# Patient Record
Sex: Female | Born: 1955 | Race: Black or African American | Hispanic: No | Marital: Married | Smoking: Never smoker
Health system: Southern US, Community
[De-identification: ages and names within clinical notes are randomized; demographics above are authoritative.]

## PROBLEM LIST (undated history)

## (undated) DIAGNOSIS — I10 Essential (primary) hypertension: Secondary | ICD-10-CM

## (undated) DIAGNOSIS — G514 Facial myokymia: Secondary | ICD-10-CM

## (undated) DIAGNOSIS — Z923 Personal history of irradiation: Secondary | ICD-10-CM

## (undated) DIAGNOSIS — K08409 Partial loss of teeth, unspecified cause, unspecified class: Secondary | ICD-10-CM

## (undated) HISTORY — PX: BREAST CYST EXCISION: SHX579

## (undated) HISTORY — PX: BREAST LUMPECTOMY: SHX2

## (undated) HISTORY — DX: Essential (primary) hypertension: I10

## (undated) HISTORY — PX: TOOTH EXTRACTION: SUR596

## (undated) HISTORY — PX: BUNIONECTOMY: SHX129

## (undated) HISTORY — DX: Partial loss of teeth, unspecified cause, unspecified class: K08.409

## (undated) HISTORY — DX: Facial myokymia: G51.4

---

## 1999-07-16 ENCOUNTER — Ambulatory Visit (HOSPITAL_COMMUNITY): Admission: RE | Admit: 1999-07-16 | Discharge: 1999-07-16 | Payer: Self-pay | Admitting: *Deleted

## 1999-08-02 ENCOUNTER — Ambulatory Visit (HOSPITAL_COMMUNITY): Admission: RE | Admit: 1999-08-02 | Discharge: 1999-08-02 | Payer: Self-pay | Admitting: *Deleted

## 1999-08-25 ENCOUNTER — Encounter: Admission: RE | Admit: 1999-08-25 | Discharge: 1999-11-23 | Payer: Self-pay | Admitting: Radiation Oncology

## 1999-11-08 ENCOUNTER — Other Ambulatory Visit: Admission: RE | Admit: 1999-11-08 | Discharge: 1999-11-08 | Payer: Self-pay | Admitting: Obstetrics and Gynecology

## 1999-11-24 ENCOUNTER — Encounter: Admission: RE | Admit: 1999-11-24 | Discharge: 2000-02-22 | Payer: Self-pay | Admitting: Radiation Oncology

## 2000-05-23 ENCOUNTER — Encounter: Payer: Self-pay | Admitting: General Surgery

## 2000-05-23 ENCOUNTER — Encounter: Admission: RE | Admit: 2000-05-23 | Discharge: 2000-05-23 | Payer: Self-pay | Admitting: General Surgery

## 2000-07-21 ENCOUNTER — Encounter: Admission: RE | Admit: 2000-07-21 | Discharge: 2000-07-21 | Payer: Self-pay | Admitting: Oncology

## 2000-07-21 ENCOUNTER — Encounter: Payer: Self-pay | Admitting: Oncology

## 2001-05-25 ENCOUNTER — Encounter: Payer: Self-pay | Admitting: General Surgery

## 2001-05-25 ENCOUNTER — Encounter: Admission: RE | Admit: 2001-05-25 | Discharge: 2001-05-25 | Payer: Self-pay | Admitting: General Surgery

## 2001-08-01 ENCOUNTER — Other Ambulatory Visit: Admission: RE | Admit: 2001-08-01 | Discharge: 2001-08-01 | Payer: Self-pay | Admitting: Obstetrics and Gynecology

## 2002-06-11 ENCOUNTER — Encounter: Admission: RE | Admit: 2002-06-11 | Discharge: 2002-06-11 | Payer: Self-pay | Admitting: General Surgery

## 2002-06-11 ENCOUNTER — Encounter: Payer: Self-pay | Admitting: General Surgery

## 2002-12-13 ENCOUNTER — Other Ambulatory Visit: Admission: RE | Admit: 2002-12-13 | Discharge: 2002-12-13 | Payer: Self-pay | Admitting: Obstetrics and Gynecology

## 2003-03-06 ENCOUNTER — Encounter: Payer: Self-pay | Admitting: General Surgery

## 2003-03-06 ENCOUNTER — Encounter: Admission: RE | Admit: 2003-03-06 | Discharge: 2003-03-06 | Payer: Self-pay | Admitting: General Surgery

## 2003-03-31 ENCOUNTER — Ambulatory Visit (HOSPITAL_BASED_OUTPATIENT_CLINIC_OR_DEPARTMENT_OTHER): Admission: RE | Admit: 2003-03-31 | Discharge: 2003-03-31 | Payer: Self-pay | Admitting: General Surgery

## 2003-03-31 ENCOUNTER — Encounter: Payer: Self-pay | Admitting: General Surgery

## 2003-03-31 ENCOUNTER — Encounter: Admission: RE | Admit: 2003-03-31 | Discharge: 2003-03-31 | Payer: Self-pay | Admitting: General Surgery

## 2004-08-02 ENCOUNTER — Encounter: Admission: RE | Admit: 2004-08-02 | Discharge: 2004-08-02 | Payer: Self-pay | Admitting: General Surgery

## 2004-08-16 ENCOUNTER — Other Ambulatory Visit: Admission: RE | Admit: 2004-08-16 | Discharge: 2004-08-16 | Payer: Self-pay | Admitting: Obstetrics and Gynecology

## 2005-08-31 ENCOUNTER — Other Ambulatory Visit: Admission: RE | Admit: 2005-08-31 | Discharge: 2005-08-31 | Payer: Self-pay | Admitting: Obstetrics and Gynecology

## 2005-12-02 ENCOUNTER — Encounter: Admission: RE | Admit: 2005-12-02 | Discharge: 2005-12-02 | Payer: Self-pay | Admitting: General Surgery

## 2010-12-12 ENCOUNTER — Encounter: Payer: Self-pay | Admitting: General Surgery

## 2017-04-19 ENCOUNTER — Encounter: Payer: Self-pay | Admitting: Gastroenterology

## 2017-06-07 ENCOUNTER — Ambulatory Visit (AMBULATORY_SURGERY_CENTER): Payer: Self-pay | Admitting: *Deleted

## 2017-06-07 VITALS — Ht 64.0 in | Wt 131.0 lb

## 2017-06-07 DIAGNOSIS — Z1211 Encounter for screening for malignant neoplasm of colon: Secondary | ICD-10-CM

## 2017-06-07 MED ORDER — NA SULFATE-K SULFATE-MG SULF 17.5-3.13-1.6 GM/177ML PO SOLN: ORAL | 0 refills | Status: DC

## 2017-06-07 NOTE — Progress Notes (Signed)
Patient denies any allergies to eggs or soy. Patient denies any problems with anesthesia/sedation. Patient denies any oxygen use at home and does not take any diet/weight loss medications. EMMI education assisgned to patient on colonoscopy, this was explained and instructions given to patient. 

## 2017-06-21 ENCOUNTER — Ambulatory Visit (AMBULATORY_SURGERY_CENTER): Payer: BC Managed Care – PPO | Admitting: Gastroenterology

## 2017-06-21 ENCOUNTER — Encounter: Payer: Self-pay | Admitting: Gastroenterology

## 2017-06-21 VITALS — BP 147/86 | HR 58 | Temp 98.2°F | Resp 15 | Ht 64.0 in | Wt 131.0 lb

## 2017-06-21 DIAGNOSIS — D123 Benign neoplasm of transverse colon: Secondary | ICD-10-CM | POA: Diagnosis not present

## 2017-06-21 DIAGNOSIS — Z1211 Encounter for screening for malignant neoplasm of colon: Secondary | ICD-10-CM

## 2017-06-21 DIAGNOSIS — D124 Benign neoplasm of descending colon: Secondary | ICD-10-CM | POA: Diagnosis not present

## 2017-06-21 DIAGNOSIS — D122 Benign neoplasm of ascending colon: Secondary | ICD-10-CM | POA: Diagnosis not present

## 2017-06-21 DIAGNOSIS — Z1212 Encounter for screening for malignant neoplasm of rectum: Secondary | ICD-10-CM | POA: Diagnosis not present

## 2017-06-21 MED ORDER — SODIUM CHLORIDE 0.9 % IV SOLN: 500.0000 mL | INTRAVENOUS | Status: AC

## 2017-06-21 NOTE — Patient Instructions (Signed)
Handout given on polyps  YOU HAD AN ENDOSCOPIC PROCEDURE TODAY: Refer to the procedure report and other information in the discharge instructions given to you for any specific questions about what was found during the examination. If this information does not answer your questions, please call [place] office at [phone] to clarify.   YOU SHOULD EXPECT: Some feelings of bloating in the abdomen. Passage of more gas than usual. Walking can help get rid of the air that was put into your GI tract during the procedure and reduce the bloating. If you had a lower endoscopy (such as a colonoscopy or flexible sigmoidoscopy) you may notice spotting of blood in your stool or on the toilet paper. Some abdominal soreness may be present for a day or two, also.  DIET: Your first meal following the procedure should be a light meal and then it is ok to progress to your normal diet. A half-sandwich or bowl of soup is an example of a good first meal. Heavy or fried foods are harder to digest and may make you feel nauseous or bloated. Drink plenty of fluids but you should avoid alcoholic beverages for 24 hours. If you had a esophageal dilation, please see attached instructions for diet.    ACTIVITY: Your care partner should take you home directly after the procedure. You should plan to take it easy, moving slowly for the rest of the day. You can resume normal activity the day after the procedure however YOU SHOULD NOT DRIVE, use power tools, machinery or perform tasks that involve climbing or major physical exertion for 24 hours (because of the sedation medicines used during the test).   SYMPTOMS TO REPORT IMMEDIATELY: A gastroenterologist can be reached at any hour. Please call [phone]  for any of the following symptoms:  Following lower endoscopy (colonoscopy, flexible sigmoidoscopy) Excessive amounts of blood in the stool  Significant tenderness, worsening of abdominal pains  Swelling of the abdomen that is  new, acute  Fever of 100 or higher    FOLLOW UP:  If any biopsies were taken you will be contacted by phone or by letter within the next 1-3 weeks. Call [phone]  if you have not heard about the biopsies in 3 weeks.  Please also call with any specific questions about appointments or follow up tests.  

## 2017-06-21 NOTE — Op Note (Signed)
[facility] Patient Name: Christine Lozano Procedure Date: 06/21/2017 10:44 AM MRN: [id number] Endoscopist: Remo Lipps P. Trystan Eads MD, MD Age: 61 Referring MD:  Date of Birth: 09/17/1956 Gender: Female Account #: [id number] Procedure:                Colonoscopy Indications:              Screening for colorectal malignant neoplasm, This                            is the patient's first colonoscopy Medicines:                Monitored Anesthesia Care Procedure:                Pre-Anesthesia Assessment:                           - Prior to the procedure, a History and Physical                            was performed, and patient medications and                            allergies were reviewed. The patient's tolerance of                            previous anesthesia was also reviewed. The risks                            and benefits of the procedure and the sedation                            options and risks were discussed with the patient.                            All questions were answered, and informed consent                            was obtained. Prior Anticoagulants: The patient has                            taken no previous anticoagulant or antiplatelet                            agents. ASA Grade Assessment: I - A normal, healthy                            patient. After reviewing the risks and benefits,                            the patient was deemed in satisfactory condition to                            undergo the procedure.  After obtaining informed consent, the colonoscope                            was passed under direct vision. Throughout the                            procedure, the patient's blood pressure, pulse, and                            oxygen saturations were monitored continuously. The                            Colonoscope was introduced through the anus and                            advanced to the the cecum,  identified by                            appendiceal orifice and ileocecal valve. The                            colonoscopy was performed without difficulty. The                            patient tolerated the procedure well. The quality                            of the bowel preparation was good. The ileocecal                            valve, appendiceal orifice, and rectum were                            photographed. Scope In: 10:53:54 AM Scope Out: 11:13:34 AM Scope Withdrawal Time: 0 hours 14 minutes 29 seconds  Total Procedure Duration: 0 hours 19 minutes 40 seconds  Findings:                 The perianal and digital rectal examinations were                            normal.                           A 4 mm polyp was found in the ascending colon. The                            polyp was sessile. The polyp was removed with a                            cold snare. Resection and retrieval were complete.                           A 3 mm polyp was found in the transverse colon. The  polyp was sessile. The polyp was removed with a                            cold snare. Resection and retrieval were complete.                           A 3 mm polyp was found in the descending colon. The                            polyp was sessile. The polyp was removed with a                            cold snare. Resection and retrieval were complete.                           A few small-mouthed diverticula were found in the                            transverse colon and ascending colon.                           The colon was tortous. The rectal vault was small.                            The exam was otherwise without abnormality on                            direct and retroflexion views. Complications:            No immediate complications. Estimated blood loss:                            Minimal. Estimated Blood Loss:     Estimated blood loss was minimal. Impression:                - One 4 mm polyp in the ascending colon, removed                            with a cold snare. Resected and retrieved.                           - One 3 mm polyp in the transverse colon, removed                            with a cold snare. Resected and retrieved.                           - One 3 mm polyp in the descending colon, removed                            with a cold snare. Resected and retrieved.                           -  Diverticulosis in the transverse colon and in the                            ascending colon.                           - Tortous colon, small rectal vault.                           - The examination was otherwise normal on direct                            and retroflexion views. Recommendation:           - Patient has a contact number available for                            emergencies. The signs and symptoms of potential                            delayed complications were discussed with the                            patient. Return to normal activities tomorrow.                            Written discharge instructions were provided to the                            patient.                           - Resume previous diet.                           - Continue present medications.                           - Await pathology results.                           - Repeat colonoscopy is recommended for                            surveillance. The colonoscopy date will be                            determined after pathology results from today's                            exam become available for review. Remo Lipps P. Sadie Hazelett MD, MD 06/21/2017 11:17:44 AM This report has been signed electronically.

## 2017-06-21 NOTE — Progress Notes (Signed)
Called to room to assist during endoscopic procedure.  Patient ID and intended procedure confirmed with present staff. Received instructions for my participation in the procedure from the performing physician.  

## 2017-06-21 NOTE — Progress Notes (Signed)
Patient awakening,vss,report to rn 

## 2017-06-22 ENCOUNTER — Telehealth: Payer: Self-pay | Admitting: *Deleted

## 2017-06-22 NOTE — Telephone Encounter (Signed)
No answer for follow up call, left message and will attempt to call back later this afternoon. SM

## 2017-06-22 NOTE — Telephone Encounter (Signed)
  Follow up Call-  Call back number 06/21/2017  Post procedure Call Back phone  # [phone]  Permission to leave phone message Yes  Some recent data might be hidden     Patient questions:  Do you have a fever, pain , or abdominal swelling? No. Pain Score  0 *  Have you tolerated food without any problems? Yes.    Have you been able to return to your normal activities? Yes.    Do you have any questions about your discharge instructions: Diet   No. Medications  No. Follow up visit  No.  Do you have questions or concerns about your Care? No.  Actions: * If pain score is 4 or above: No action needed, pain <4.

## 2017-06-27 ENCOUNTER — Encounter: Payer: Self-pay | Admitting: Gastroenterology

## 2019-10-21 ENCOUNTER — Other Ambulatory Visit: Payer: Self-pay

## 2019-10-21 ENCOUNTER — Encounter: Payer: Self-pay | Admitting: Neurology

## 2019-10-21 ENCOUNTER — Ambulatory Visit (INDEPENDENT_AMBULATORY_CARE_PROVIDER_SITE_OTHER): Payer: BC Managed Care – PPO | Admitting: Neurology

## 2019-10-21 VITALS — BP 131/83 | HR 63 | Temp 97.5°F | Ht 64.0 in | Wt 134.0 lb

## 2019-10-21 DIAGNOSIS — G245 Blepharospasm: Secondary | ICD-10-CM | POA: Diagnosis not present

## 2019-10-21 NOTE — Progress Notes (Signed)
PATIENT: Christine Lozano DOB: [birth year]  Chief Complaint  Patient presents with  . Facial Twitching    She is here, with her husband, Marc Morgans, for intermittent facial twitching and bilateral eye spasms.  Marland Kitchen PCP    Fanny Bien, MD  . Ophthalmology    Calvert Cantor, MD - referring provider     HISTORICAL  Christine Lozano is a 63 year old female, seen in request by her ophthalmologist Dr. Calvert Cantor and her primary care physician Dr., [name] for evaluation of bilateral facial muscle spasm, initial evaluation was on October 21, 2019.  I have reviewed and summarized the referring note from the referring physician.  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  ALLERGIES: No Known Allergies  HOME MEDICATIONS: Current Outpatient Medications  Medication Sig Dispense Refill  . amLODipine (NORVASC) 5 MG tablet Take 1 tablet by mouth daily.     No current facility-administered medications for this visit.     PAST MEDICAL HISTORY: Past Medical History:  Diagnosis Date  . Facial twitching   . Hypertension     PAST SURGICAL HISTORY: Past Surgical History:  Procedure Laterality Date  . BREAST CYST EXCISION Left [id number]   x2  . BUNIONECTOMY Left 10 years ago  . TOOTH EXTRACTION      FAMILY HISTORY: Family History  Problem  Relation Age of Onset  . Cancer Mother   . Diabetes Father   . Colon cancer Neg Hx     SOCIAL HISTORY: Social History   Socioeconomic History  . Marital status: Married    Spouse name: Not on file  . Number of children: 2  . Years of education: college  . Highest education level: Master's degree (e.g., MA, MS, MEng, MEd, MSW, MBA)  Occupational History  . Occupation: Retired  Scientific laboratory technician  . Financial resource strain: Not on file  . Food insecurity    Worry: Not on file    Inability: Not on file  . Transportation needs    Medical: Not on file    Non-medical: Not on file  Tobacco Use  . Smoking status: Never Smoker  . Smokeless tobacco: Never Used  Substance and Sexual Activity  . Alcohol use: No  . Drug use: No  . Sexual activity: Not on file  Lifestyle  . Physical activity    Days per week: Not on file    Minutes per session: Not on file  . Stress: Not on file  Relationships  . Social Herbalist on phone: Not on file    Gets together: Not on file    Attends religious service: Not on file    Active member of club or organization: Not on file    Attends meetings of clubs or organizations: Not on file    Relationship status: Not on file  . Intimate partner violence  Fear of current or ex partner: Not on file    Emotionally abused: Not on file    Physically abused: Not on file    Forced sexual activity: Not on file  Other Topics Concern  . Not on file  Social History Narrative   Lives at home with her husband.   Left-handed.   No daily caffeine use.     PHYSICAL EXAM   Vitals:   10/21/19 0851  BP: 131/83  Pulse: 63  Temp: (!) 97.5 F (36.4 C)  Weight: 134 lb (60.8 kg)  Height: 5\' 4"  (1.626 m)    Not recorded      Body mass index is 23 kg/m.  PHYSICAL EXAMNIATION:  Gen: NAD, conversant, well nourised, well groomed                     Cardiovascular: Regular rate rhythm, no peripheral edema, warm, nontender. Eyes: Conjunctivae  clear without exudates or hemorrhage Neck: Supple, no carotid bruits. Pulmonary: Clear to auscultation bilaterally   NEUROLOGICAL EXAM:  MENTAL STATUS: Speech:    Speech is normal; fluent and spontaneous with normal comprehension.  Cognition:     Orientation to time, place and person     Normal recent and remote memory     Normal Attention span and concentration     Normal Language, naming, repeating,spontaneous speech     Fund of knowledge   CRANIAL NERVES: CN II: Visual fields are full to confrontation.  Pupils are round equal and briskly reactive to light. CN III, IV, VI: extraocular movement are normal. No ptosis. CN V: Facial sensation is intact to pinprick in all 3 divisions bilaterally. Corneal responses are intact.  CN VII: Face is symmetric with normal eye closure and smile.  She has forceful eye closure, significant corrugate, procerus,  frontalis muscle movement CN VIII: Hearing is normal to causal conversation. CN IX, X: Palate elevates symmetrically. Phonation is normal. CN XI: Head turning and shoulder shrug are intact CN XII: Tongue is midline with normal movements and no atrophy.  MOTOR: There is no pronator drift of out-stretched arms. Muscle bulk and tone are normal. Muscle strength is normal.  REFLEXES: Reflexes are 2+ and symmetric at the biceps, triceps, knees, and ankles. Plantar responses are flexor.  SENSORY: Intact to light touch, pinprick and vibratory sensation are intact in fingers and toes.  COORDINATION: There is no trunk or limb dysmetria noted.  GAIT/STANCE: Posture is normal. Gait is steady with normal steps, base, arm swing, and turning. Heel and toe walking are normal. Tandem gait is normal.  Romberg is absent.   DIAGNOSTIC DATA (LABS, IMAGING, TESTING) - I reviewed patient records, labs, notes, testing and imaging myself where available.   ASSESSMENT AND PLAN  Christine Lozano is a 63 y.o. female   Blepharospasm  MRI of the brain  to rule out structural lesion  EMG guided botulism toxin injection, asking for Xeomin 50 units  Marcial Pacas, M.D. Ph.D.  [facility] [address] [place], [place] [address] Ph: [phone] Fax: [phone]  CC: Calvert Cantor, MD

## 2019-10-21 NOTE — Patient Instructions (Signed)
Blepharospasm is a focal dystonia involving the orbicularis oculi muscles and other periocular muscles, including the procerus and corrugator muscles [79,80]. Clinical manifestations include increased blinking and spasms of involuntary eye closure. Symptoms are usually bilateral, synchronous, and symmetric, but may be asymmetric. Involuntary eye closure caused by forcible dystonic spasms of the orbicularis oculi should be distinguished from the more curtain-like "apraxia" of eyelid opening due to failure of levator palpebrae contraction. In some patients (particularly those with atypical parkinsonian syndromes), the two conditions can coexist. Proposed criteria for the diagnosis of blepharospasm are as follows [81]: ?The presence of stereotyped, bilateral, and synchronous orbicularis oculi spasms inducing eyelid narrowing/closure ?At least one of the following: .Presence of an effective "sensory trick" (ie, a maneuver, such as lightly touching the affected body part, that reduces or abolishes the dystonic symptoms) .Increased blinking Blepharospasm may be mild and nondisabling, or it may cause significant disability through interference with vision as a result of the eye closure. Patients with blepharospasm typically complain of increased spasms under conditions of bright light or stress, such as driving a car in traffic. Pain is infrequently associated with blepharospasm, although a feeling of irritation in the eyes (foreign body sensation) may be one of the first symptoms [82]. Blepharospasm may be associated with dystonia of the lower face and/or jaw (Meige syndrome or Brueghel syndrome)

## 2019-11-27 ENCOUNTER — Encounter: Payer: Self-pay | Admitting: Neurology

## 2019-11-27 ENCOUNTER — Ambulatory Visit (INDEPENDENT_AMBULATORY_CARE_PROVIDER_SITE_OTHER): Payer: BC Managed Care – PPO | Admitting: Neurology

## 2019-11-27 ENCOUNTER — Other Ambulatory Visit: Payer: Self-pay

## 2019-11-27 VITALS — BP 152/82 | HR 64 | Temp 97.1°F | Ht 64.0 in | Wt 136.0 lb

## 2019-11-27 DIAGNOSIS — G245 Blepharospasm: Secondary | ICD-10-CM

## 2019-11-27 MED ORDER — INCOBOTULINUMTOXINA 50 UNITS IM SOLR
50.0000 [IU] | INTRAMUSCULAR | Status: DC
  Administered 2019-11-27: 15:00:00 50 [IU] via INTRAMUSCULAR

## 2019-11-27 NOTE — Progress Notes (Signed)
**  Xeomin 50 units x 1 vial, NDC 0259-1605-01, Lot [id number], Exp 10/2021, office supply.//mck,rn** 

## 2019-11-27 NOTE — Progress Notes (Signed)
PATIENT: ROCHELE PHILSON DOB: 1955/11/25  Chief Complaint  Patient presents with  . Blepharospasm    Xeomin 50 units x 1 vial -  office supply     HISTORICAL  RONIA FERRY is a 64 year old female, seen in request by her ophthalmologist Dr. Calvert Cantor and her primary care physician Dr., [name] for evaluation of bilateral facial muscle spasm, initial evaluation was on October 21, 2019.  I have reviewed and summarized the referring note from the referring physician.  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 She did not have MRI of the brain due to cost concerns,   REVIEW OF SYSTEMS: Full 14 system review of systems performed and notable only for as above All other review of systems were negative.  ALLERGIES: No Known Allergies  HOME MEDICATIONS: Current Outpatient Medications  Medication Sig Dispense Refill  . amLODipine (NORVASC) 5 MG tablet Take 1 tablet by mouth daily.    Marland Kitchen incobotulinumtoxinA (XEOMIN) 50 units SOLR injection Inject 50 Units into the muscle every 3 (three) months.     No current facility-administered medications for this visit.    PAST MEDICAL HISTORY: Past Medical History:  Diagnosis Date  . Facial twitching   . Hypertension     PAST SURGICAL HISTORY: Past Surgical History:  Procedure Laterality Date  . BREAST CYST EXCISION Left  [id number]   x2  . BUNIONECTOMY Left 10 years ago  . TOOTH EXTRACTION      FAMILY HISTORY: Family History  Problem Relation Age of Onset  . Cancer Mother   . Diabetes Father   . Colon cancer Neg Hx     SOCIAL HISTORY: Social History   Socioeconomic History  . Marital status: Married    Spouse name: Not on file  . Number of children: 2  . Years of education: college  . Highest education level: Master's degree (e.g., MA, MS, MEng, MEd, MSW, MBA)  Occupational History  . Occupation: Retired  Tobacco Use  . Smoking status: Never Smoker  . Smokeless tobacco: Never Used  Substance and Sexual Activity  . Alcohol use: No  . Drug use: No  . Sexual activity: Not on file  Other Topics Concern  . Not on file  Social History Narrative   Lives at home with her husband.   Left-handed.   No daily caffeine use.   Social Determinants of Health   Financial Resource Strain:   . Difficulty of Paying Living Expenses: Not on file  Food Insecurity:   . Worried About Charity fundraiser in the Last Year: Not on file  . Ran Out of Food in the Last Year: Not on file  Transportation Needs:   . Lack of Transportation (Medical): Not on file  . Lack of Transportation (Non-Medical): Not on file  Physical Activity:   . Days of Exercise per Week: Not on file  . Minutes of Exercise per Session:  Not on file  Stress:   . Feeling of Stress : Not on file  Social Connections:   . Frequency of Communication with Friends and Family: Not on file  . Frequency of Social Gatherings with Friends and Family: Not on file  . Attends Religious Services: Not on file  . Active Member of Clubs or Organizations: Not on file  . Attends Archivist Meetings: Not on file  . Marital Status: Not on file  Intimate Partner Violence:   . Fear of Current or Ex-Partner: Not on file  . Emotionally Abused: Not on file  . Physically Abused: Not on file  . Sexually Abused: Not on file     PHYSICAL EXAM     Vitals:   11/27/19 1358  BP: (!) 152/82  Pulse: 64  Temp: (!) 97.1 F (36.2 C)  Weight: 136 lb (61.7 kg)  Height: 5\' 4"  (1.626 m)    Not recorded      Body mass index is 23.34 kg/m.  PHYSICAL EXAMNIATION: She has frequent forceful bilateral closing,  DIAGNOSTIC DATA (LABS, IMAGING, TESTING) - I reviewed patient records, labs, notes, testing and imaging myself where available.   ASSESSMENT AND PLAN  AUBRIANNE STEPRO is a 64 y.o. female   Blepharospasm  EMG guided Xeomin injection, Xeomin 50 units, used 30 units, discarded 20 units  Right orbicularis oculi at 4, 5, 6, 7, 8, 9:00 (2.5 units each x6=15 units) Left orbicularis oculi at 3, 4, 5, 6, 7, 8 =15 units  Right corrugate 5 units Left corrugate 5 units Prosperous 5 units   Marcial Pacas, M.D. Ph.D.  [facility] [address] [place], [place] [address] Ph: [phone] Fax: [phone]  CC: Calvert Cantor, MD

## 2019-12-02 ENCOUNTER — Telehealth: Payer: Self-pay | Admitting: Neurology

## 2019-12-02 NOTE — Telephone Encounter (Signed)
I was able to get in touch with the patient.  She denies any drooping but just has some bruising on around her right eye.  She understands this should heal with time.  She will call back with any further concerns.

## 2019-12-02 NOTE — Telephone Encounter (Signed)
Left message, on both home and mobile numbers, requesting a return call.

## 2019-12-02 NOTE — Telephone Encounter (Signed)
Left second set of message on both home and mobile numbers.

## 2019-12-02 NOTE — Telephone Encounter (Signed)
Patient had her injections on 11/27/2019, she states the next evening her eye turned black, she wants to know if this is normal and when it will go away

## 2019-12-12 ENCOUNTER — Telehealth: Payer: Self-pay

## 2019-12-12 NOTE — Telephone Encounter (Signed)
I called the patient.  The droopy eyelid is a known complication from Xeomin injections around the eye, she was getting this for blepharospasm.  This should be transient in nature, there is no specific treatment other than conservative measures to let the strength return.

## 2019-12-12 NOTE — Telephone Encounter (Signed)
Patient called stating she is having drooping eyelid (left) and was unsure if they are going to get better post her injection.   Please follow up

## 2020-01-13 ENCOUNTER — Ambulatory Visit: Payer: BC Managed Care – PPO | Attending: Family

## 2020-01-13 DIAGNOSIS — Z23 Encounter for immunization: Secondary | ICD-10-CM | POA: Insufficient documentation

## 2020-01-13 NOTE — Progress Notes (Signed)
   Covid-19 Vaccination Clinic  Name:  Christine Lozano    MRN: [id number] DOB: 22-Mar-1956  01/13/2020  [name] was observed post Covid-19 immunization for 15 minutes without incidence. She was provided with Vaccine Information Sheet and instruction to access the V-Safe system.   [name] was instructed to call 911 with any severe reactions post vaccine: Marland Kitchen Difficulty breathing  . Swelling of your face and throat  . A fast heartbeat  . A bad rash all over your body  . Dizziness and weakness    Immunizations Administered    Name Date Dose VIS Date Route   Moderna COVID-19 Vaccine 01/13/2020 10:30 AM 0.5 mL 10/22/2019 Intramuscular   Manufacturer: Moderna   Lot: [id number]   [id number]

## 2020-02-11 ENCOUNTER — Ambulatory Visit: Payer: BC Managed Care – PPO | Attending: Family

## 2020-02-11 DIAGNOSIS — Z23 Encounter for immunization: Secondary | ICD-10-CM

## 2020-02-11 NOTE — Progress Notes (Signed)
   Covid-19 Vaccination Clinic  Name:  [name]    MRN: [id number] DOB: December 13, 1955  02/11/2020  [name] was observed post Covid-19 immunization for 15 minutes without incident. She was provided with Vaccine Information Sheet and instruction to access the V-Safe system.   [name] was instructed to call 911 with any severe reactions post vaccine: Marland Kitchen Difficulty breathing  . Swelling of face and throat  . A fast heartbeat  . A bad rash all over body  . Dizziness and weakness   Immunizations Administered    Name Date Dose VIS Date Route   Moderna COVID-19 Vaccine 02/11/2020 12:24 PM 0.5 mL 10/22/2019 Intramuscular   Manufacturer: Moderna   LotMV:[id number]   ThayerBE:[id number]

## 2020-02-20 ENCOUNTER — Telehealth: Payer: Self-pay | Admitting: *Deleted

## 2020-02-20 NOTE — Telephone Encounter (Signed)
I called [employer] [phone] and spoke to [place].  He states that [phone] is billable but requires PA.  [phone] is billable and does not require PA.  Ref# for call is [id number].  He directed me to call [phone] to obtain PA.  I called and spoke to Thayer who states there is already a PA on file.  PA# is [id number] Valid from 11-26-2019 through 11/25/2020.

## 2020-02-27 ENCOUNTER — Other Ambulatory Visit: Payer: Self-pay

## 2020-02-27 ENCOUNTER — Encounter: Payer: Self-pay | Admitting: Neurology

## 2020-02-27 ENCOUNTER — Ambulatory Visit (INDEPENDENT_AMBULATORY_CARE_PROVIDER_SITE_OTHER): Payer: BC Managed Care – PPO | Admitting: Neurology

## 2020-02-27 VITALS — BP 144/82 | HR 72 | Temp 96.0°F | Ht 64.0 in | Wt 137.5 lb

## 2020-02-27 DIAGNOSIS — G245 Blepharospasm: Secondary | ICD-10-CM

## 2020-02-27 NOTE — Progress Notes (Signed)
PATIENT: Christine Lozano DOB: [birth year]  Chief Complaint  Patient presents with  . Blepharospasm    Xeomin 50 units x 1 vial - office supply     HISTORICAL  Christine Lozano is a 64 year old female, seen in request by her ophthalmologist Dr. Calvert Cantor and her primary care physician Dr., [name] for evaluation of bilateral facial muscle spasm, initial evaluation was on October 21, 2019.  I have reviewed and summarized the referring note from the referring physician.  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 She did not have MRI of the brain due to cost concerns,  UPDATE March 06 2020: She only has mild transient improvement with previously EMG guided Xeomin injection for her bilateral carpal spasm, there was no significant side effect noted.  REVIEW OF SYSTEMS: Full 14 system review of systems performed and notable only for as above All other review of systems were negative.  ALLERGIES: No Known Allergies  HOME MEDICATIONS: Current Outpatient Medications  Medication Sig Dispense Refill  . amLODipine (NORVASC) 5 MG tablet Take 1 tablet by mouth daily.    Marland Kitchen incobotulinumtoxinA (XEOMIN) 50 units SOLR injection Inject 50 Units into the muscle every 3 (three) months.     No current facility-administered medications for this visit.    PAST MEDICAL HISTORY: Past  Medical History:  Diagnosis Date  . Facial twitching   . Hypertension     PAST SURGICAL HISTORY: Past Surgical History:  Procedure Laterality Date  . BREAST CYST EXCISION Left [id number]   x2  . BUNIONECTOMY Left 10 years ago  . TOOTH EXTRACTION      FAMILY HISTORY: Family History  Problem Relation Age of Onset  . Cancer Mother   . Diabetes Father   . Colon cancer Neg Hx     SOCIAL HISTORY: Social History   Socioeconomic History  . Marital status: Married    Spouse name: Not on file  . Number of children: 2  . Years of education: college  . Highest education level: Master's degree (e.g., MA, MS, MEng, MEd, MSW, MBA)  Occupational History  . Occupation: Retired  Tobacco Use  . Smoking status: Never Smoker  . Smokeless tobacco: Never Used  Substance and Sexual Activity  . Alcohol use: No  . Drug use: No  . Sexual activity: Not on file  Other Topics Concern  . Not on file  Social History Narrative   Lives at home with her husband.   Left-handed.   No daily caffeine use.   Social Determinants of Health   Financial Resource Strain:   . Difficulty of Paying Living Expenses:   Food Insecurity:   . Worried About Charity fundraiser in the Last Year:   . Arboriculturist in the Last Year:   Transportation Needs:   . Film/video editor (Medical):   Marland Kitchen Lack of Transportation (Non-Medical):   Physical Activity:   .  Days of Exercise per Week:   . Minutes of Exercise per Session:   Stress:   . Feeling of Stress :   Social Connections:   . Frequency of Communication with Friends and Family:   . Frequency of Social Gatherings with Friends and Family:   . Attends Religious Services:   . Active Member of Clubs or Organizations:   . Attends Archivist Meetings:   Marland Kitchen Marital Status:   Intimate Partner Violence:   . Fear of Current or Ex-Partner:   . Emotionally Abused:   Marland Kitchen Physically Abused:   . Sexually Abused:      PHYSICAL EXAM   Vitals:    02/27/20 1527  BP: (!) 144/82  Pulse: 72  Temp: (!) 96 F (35.6 C)  Weight: 137 lb 8 oz (62.4 kg)  Height: 5\' 4"  (1.626 m)    Not recorded      Body mass index is 23.6 kg/m.  PHYSICAL EXAMNIATION: She has frequent forceful bilateral eyes closing, most movement  DIAGNOSTIC DATA (LABS, IMAGING, TESTING) - I reviewed patient records, labs, notes, testing and imaging myself where available.   ASSESSMENT AND PLAN  Christine Lozano is a 64 y.o. female   Blepharospasm  EMG guided Xeomin injection, Xeomin 50 units,   Right orbicularis oculi at 4, 5, 6, 7, 8, 9:00 (2.5 units each x6=15 units) Left orbicularis oculi at 3, 4, 5, 6, 7, 8 =15 units  Right corrugate 5 units Left corrugate 5 units Prosperous 5 units  Right frontalis 5 unitsx2=10 units Left frontalis 5 unitsx2=10  units  Marcial Pacas, M.D. Ph.D.  [facility] [address] [place], [place] [address] Ph: [phone] Fax: [phone]  CC: Calvert Cantor, MD

## 2020-02-27 NOTE — Progress Notes (Signed)
**  Xeomin 50 units x 1 vial, NDC 0259-1605-01, Lot [id number], Exp 10/2021, office supply.//mck,rn** 

## 2020-03-06 DIAGNOSIS — G245 Blepharospasm: Secondary | ICD-10-CM

## 2020-03-06 MED ORDER — INCOBOTULINUMTOXINA 50 UNITS IM SOLR
50.0000 [IU] | INTRAMUSCULAR | Status: DC
  Administered 2020-03-06: 14:00:00 50 [IU] via INTRAMUSCULAR

## 2020-06-02 ENCOUNTER — Encounter: Payer: Self-pay | Admitting: Neurology

## 2020-06-02 ENCOUNTER — Ambulatory Visit (INDEPENDENT_AMBULATORY_CARE_PROVIDER_SITE_OTHER): Payer: BC Managed Care – PPO | Admitting: Neurology

## 2020-06-02 VITALS — BP 130/88 | HR 56 | Ht 64.0 in | Wt 138.0 lb

## 2020-06-02 DIAGNOSIS — G245 Blepharospasm: Secondary | ICD-10-CM

## 2020-06-02 MED ORDER — INCOBOTULINUMTOXINA 50 UNITS IM SOLR
50.0000 [IU] | INTRAMUSCULAR | Status: DC
  Administered 2020-06-02: 50 [IU] via INTRAMUSCULAR

## 2020-06-02 NOTE — Progress Notes (Signed)
PATIENT: Christine Lozano DOB: 1956-09-15  Chief Complaint  Patient presents with  . Injections     HISTORICAL  Christine Lozano is a 64 year old female, seen in request by her ophthalmologist Dr. Calvert Cantor and her primary care physician Dr., [name] for evaluation of bilateral facial muscle spasm, initial evaluation was on October 21, 2019.  I have reviewed and summarized the referring note from the referring physician.  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 She did not have MRI of the brain due to cost concerns,  UPDATE March 06 2020: She only has mild transient improvement with previously EMG guided Xeomin injection for her bilateral carpal spasm, there was no significant side effect noted.  UPDATE June 02 2020: She reported mild improvement with previous injection, no significant side effect noted  REVIEW OF SYSTEMS: Full 14 system review of systems performed and notable only for as above All other review of systems were negative.  ALLERGIES: No Known Allergies  HOME MEDICATIONS: Current Outpatient Medications  Medication Sig Dispense Refill  . amLODipine (NORVASC) 5 MG tablet Take 1 tablet by mouth daily.    Marland Kitchen incobotulinumtoxinA (XEOMIN) 50 units SOLR injection Inject 50 Units into the muscle every 3 (three) months.     Current Facility-Administered  Medications  Medication Dose Route Frequency Provider Last Rate Last Admin  . incobotulinumtoxinA (XEOMIN) 50 units injection 50 Units  50 Units Intramuscular Q90 days Marcial Pacas, MD   50 Units at 03/06/20 1407    PAST MEDICAL HISTORY: Past Medical History:  Diagnosis Date  . Facial twitching   . Hypertension     PAST SURGICAL HISTORY: Past Surgical History:  Procedure Laterality Date  . BREAST CYST EXCISION Left [id number]   x2  . BUNIONECTOMY Left 10 years ago  . TOOTH EXTRACTION      FAMILY HISTORY: Family History  Problem Relation Age of Onset  . Cancer Mother   . Diabetes Father   . Colon cancer Neg Hx     SOCIAL HISTORY: Social History   Socioeconomic History  . Marital status: Married    Spouse name: Not on file  . Number of children: 2  . Years of education: college  . Highest education level: Master's degree (e.g., MA, MS, MEng, MEd, MSW, MBA)  Occupational History  . Occupation: Retired  Tobacco Use  . Smoking status: Never Smoker  . Smokeless tobacco: Never Used  Vaping Use  . Vaping Use: Never used  Substance and Sexual Activity  . Alcohol use: No  . Drug use: No  . Sexual activity: Not on file  Other Topics Concern  . Not on file  Social History Narrative   Lives at home with her husband.   Left-handed.   No daily caffeine use.   Social Determinants of Health   Financial Resource Strain:   . Difficulty of Paying Living Expenses:   Food Insecurity:   .  Worried About Charity fundraiser in the Last Year:   . Arboriculturist in the Last Year:   Transportation Needs:   . Film/video editor (Medical):   Marland Kitchen Lack of Transportation (Non-Medical):   Physical Activity:   . Days of Exercise per Week:   . Minutes of Exercise per Session:   Stress:   . Feeling of Stress :   Social Connections:   . Frequency of Communication with Friends and Family:   . Frequency of Social Gatherings with Friends and Family:   . Attends Religious Services:     . Active Member of Clubs or Organizations:   . Attends Archivist Meetings:   Marland Kitchen Marital Status:   Intimate Partner Violence:   . Fear of Current or Ex-Partner:   . Emotionally Abused:   Marland Kitchen Physically Abused:   . Sexually Abused:      PHYSICAL EXAM   There were no vitals filed for this visit. Not recorded     There is no height or weight on file to calculate BMI.  PHYSICAL EXAMNIATION: She has frequent forceful bilateral eyes closing, most movement  DIAGNOSTIC DATA (LABS, IMAGING, TESTING) - I reviewed patient records, labs, notes, testing and imaging myself where available.   ASSESSMENT AND PLAN  Christine Lozano is a 64 y.o. female   Blepharospasm  EMG guided Xeomin injection, Xeomin 50 units,   Right orbicularis oculi at 4, 5, 6, 7, 8, 9:00 (2.5 units each x6=15 units) Left orbicularis oculi at 3, 4, 5, 6, 7, 8 =15 units  Right corrugate 5 units Left corrugate 5 units Prosperous 5 units  Right frontalis 5 unitsx2=10 units Left frontalis 5 unitsx2=10  units  Marcial Pacas, M.D. Ph.D.  [facility] [address] [place], [place] [address] Ph: [phone] Fax: [phone]  CC: Calvert Cantor, MD

## 2020-06-02 NOTE — Progress Notes (Signed)
Xeomin 50 units X 1 vial- [id number], LOT [id number], EXP date- 02/2022. Office supply, B/B/ CBC, RN

## 2020-09-02 ENCOUNTER — Ambulatory Visit (INDEPENDENT_AMBULATORY_CARE_PROVIDER_SITE_OTHER): Payer: BC Managed Care – PPO | Admitting: Neurology

## 2020-09-02 ENCOUNTER — Encounter: Payer: Self-pay | Admitting: Neurology

## 2020-09-02 ENCOUNTER — Telehealth: Payer: Self-pay | Admitting: Neurology

## 2020-09-02 VITALS — BP 140/82 | HR 65 | Ht 64.0 in | Wt 140.5 lb

## 2020-09-02 DIAGNOSIS — G245 Blepharospasm: Secondary | ICD-10-CM

## 2020-09-02 MED ORDER — INCOBOTULINUMTOXINA 50 UNITS IM SOLR
50.0000 [IU] | INTRAMUSCULAR | Status: DC
Start: 2020-09-02 — End: 2020-12-09
  Administered 2020-09-02: 50 [IU] via INTRAMUSCULAR

## 2020-09-02 MED ORDER — XEOMIN 50 UNITS IM SOLR: 50.0000 [IU] | INTRAMUSCULAR | 3 refills | Status: DC

## 2020-09-02 NOTE — Telephone Encounter (Signed)
Faxed PA request to [facility].

## 2020-09-02 NOTE — Telephone Encounter (Signed)
Patient has appointment today. She currently has a PA on file for Xeomin with [employer] that expires 11/25/2020. I am filling out a new PA to add [place].   Christine Lozano, can you send patient's Xeomin prescription to [facility] in [place], [place]?

## 2020-09-02 NOTE — Addendum Note (Signed)
Addended by: Noberto Retort C on: 09/02/2020 08:09 AM   Modules accepted: Orders

## 2020-09-02 NOTE — Progress Notes (Signed)
**  Xeomin 50 units x 1 vilas, [place] 8280-0349-17, Lot [id number], Exp 02/2022, office supply.//mck,rn**

## 2020-09-02 NOTE — Progress Notes (Signed)
PATIENT: Christine Lozano DOB: 1956-08-07  Chief Complaint  Patient presents with  . Blepharospasm    Xeomin     HISTORICAL  Christine Lozano is a 64 year old female, seen in request by her ophthalmologist Dr. Calvert Lozano and her primary care physician Dr., Christine Lozano for evaluation of bilateral facial muscle spasm, initial evaluation was on October 21, 2019.  I have reviewed and summarized the referring note from the referring physician.  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 She did not have MRI of the brain due to cost concerns,  UPDATE March 06 2020: She only has mild transient improvement with previously EMG guided Xeomin injection for her bilateral blepharospasm, there was no significant side effect noted.  UPDATE June 02 2020: She reported mild improvement with previous injection, no significant side effect noted  UPDATE Sep 02 2020: She denies significant improvement with previous injection, there was no significant side effect noted.  REVIEW OF SYSTEMS: Full 14 system review of systems performed and notable only for as above All other review of systems were negative.  ALLERGIES: No Known Allergies  HOME MEDICATIONS: Current Outpatient Medications  Medication Sig Dispense Refill  . amLODipine (NORVASC) 5 MG tablet Take 1 tablet by mouth daily.    Marland Kitchen  incobotulinumtoxinA (XEOMIN) 50 units SOLR injection Inject 50 Units into the muscle every 3 (three) months. 1 each 3   No current facility-administered medications for this visit.    PAST MEDICAL HISTORY: Past Medical History:  Diagnosis Date  . Facial twitching   . Hypertension     PAST SURGICAL HISTORY: Past Surgical History:  Procedure Laterality Date  . BREAST CYST EXCISION Left 6712,4580   x2  . BUNIONECTOMY Left 10 years ago  . TOOTH EXTRACTION      FAMILY HISTORY: Family History  Problem Relation Age of Onset  . Cancer Mother   . Diabetes Father   . Colon cancer Neg Hx     SOCIAL HISTORY: Social History   Socioeconomic History  . Marital status: Married    Spouse name: Not on file  . Number of children: 2  . Years of education: college  . Highest education level: Master's degree (e.g., MA, MS, MEng, MEd, MSW, MBA)  Occupational History  . Occupation: Retired  Tobacco Use  . Smoking status: Never Smoker  . Smokeless tobacco: Never Used  Vaping Use  . Vaping Use: Never used  Substance and Sexual Activity  . Alcohol use: No  . Drug use: No  . Sexual activity: Not on file  Other Topics Concern  . Not on file  Social History Narrative   Lives at home with her husband.   Left-handed.   No daily caffeine use.   Social Determinants of Health   Financial Resource Strain:   . Difficulty of Paying Living Expenses: Not on file  Food Insecurity:   . Worried About [employer] of  Food in the Last Year: Not on file  . Ran Out of Food in the Last Year: Not on file  Transportation Needs:   . Lack of Transportation (Medical): Not on file  . Lack of Transportation (Non-Medical): Not on file  Physical Activity:   . Days of Exercise per Week: Not on file  . Minutes of Exercise per Session: Not on file  Stress:   . Feeling of Stress : Not on file  Social Connections:   . Frequency of Communication with Friends and Family: Not on file  . Frequency of Social  Gatherings with Friends and Family: Not on file  . Attends Religious Services: Not on file  . Active Member of Clubs or Organizations: Not on file  . Attends Archivist Meetings: Not on file  . Marital Status: Not on file  Intimate Partner Violence:   . Fear of Current or Ex-Partner: Not on file  . Emotionally Abused: Not on file  . Physically Abused: Not on file  . Sexually Abused: Not on file     PHYSICAL EXAM   Vitals:   09/02/20 1259  BP: 140/82  Pulse: 65  Weight: 140 lb 8 oz (63.7 kg)  Height: 5\' 4"  (1.626 m)   Not recorded     Body mass index is 24.12 kg/m.  PHYSICAL EXAMNIATION: She has frequent forceful bilateral eyes closing, most movement  DIAGNOSTIC DATA (LABS, IMAGING, TESTING) - I reviewed patient records, labs, notes, testing and imaging myself where available.   ASSESSMENT AND PLAN  Christine Lozano is a 64 y.o. female   Blepharospasm  EMG guided Xeomin injection, Xeomin 50 units,   Right orbicularis oculi at 2, 4, 5, 6, 7, 8, 9:00 (2.5 units each x 7=17.5 units) Left orbicularis oculi at 3, 4, 5, 6, 7, 8,9 =17.5 units  Right corrugate 5 units Left corrugate 5 units Prosperous 5 units   Christine Lozano, M.D. Ph.D.  [facility] [address], [place], [place] [address] Ph: [phone] Fax: [phone]  CC: Christine Cantor, MD

## 2020-09-02 NOTE — Telephone Encounter (Signed)
Rx sent to requested pharmacy

## 2020-09-07 ENCOUNTER — Encounter: Payer: Self-pay | Admitting: Neurology

## 2020-09-07 NOTE — Telephone Encounter (Signed)
I called [facility] and spoke with Borris to give PA information. I then called the patient and LVM asking her to call back to discuss specialty pharmacy process.

## 2020-09-07 NOTE — Telephone Encounter (Signed)
Received updated PA via fax. PA #[id number] (09/02/20- 09/01/21). [facility] added as the specialty pharmacy. Information letter with specialty pharmacy protocol has been mailed out to patient.

## 2020-09-07 NOTE — Telephone Encounter (Signed)
I received a fax from [facility] needing prescription clarification. 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 Vicente Males states she will add it to the prescription and have it reviewed.

## 2020-09-10 NOTE — Telephone Encounter (Signed)
I received a fax from [place] stating that a PA is needed from [facility] in order to fill the Xeomin. I called [facility] and spoke with PA department to begin process. PA was approved, [place] #[id number] (09/10/20- 09/10/21). I faxed the PA approval back to [facility].

## 2020-09-21 NOTE — Telephone Encounter (Signed)
I called the patient and LVM requesting she call back to discuss specialty pharmacy protocol.

## 2020-11-03 ENCOUNTER — Telehealth: Payer: Self-pay | Admitting: Neurology

## 2020-11-03 NOTE — Telephone Encounter (Signed)
Patient has a Xeomin appointment on 12/09/20. Patient called and LVM to advise she received a letter in the mail stating approval for Xeomin and wanted to know if there is anything she needs to do. I called [facility] and spoke with Gesenia to check the status of the Xeomin order. Dolphus Jenny states that the order was cancelled and placed me on hold to look further into the situation. After checking, she was still unsure. She restarted the prescription. She states once the prescription is processed, we will be contacted to schedule delivery.

## 2020-11-04 NOTE — Telephone Encounter (Signed)
I returned patient's call and LVM regarding the specialty pharmacy and letter she received, requested patient call me back to discuss.

## 2020-11-10 NOTE — Telephone Encounter (Signed)
I called [facility] and spoke with Dequandra to check status of Xeomin order. She states delivery is ready to be scheduled but they are needing patient's consent. They have attempted to reach the patient with no luck. I called the patient and LVM asking her to return my call, and to call [facility] to give consent for Xeomin shipment.

## 2020-11-23 NOTE — Telephone Encounter (Signed)
I called [facility] to check status of Xeomin. I spoke with CiCi. She states that the order is ready to schedule but they need patient's consent. CiCi placed me on hold and called the patient. The patient gave lifetime consent. Xeomin TBD 1/6.

## 2020-11-30 NOTE — Telephone Encounter (Signed)
(  1) 50U vial of Xeomin arrived from [facility]. Late entry.

## 2020-12-09 ENCOUNTER — Encounter: Payer: Self-pay | Admitting: Neurology

## 2020-12-09 ENCOUNTER — Ambulatory Visit (INDEPENDENT_AMBULATORY_CARE_PROVIDER_SITE_OTHER): Payer: BC Managed Care – PPO | Admitting: Neurology

## 2020-12-09 ENCOUNTER — Other Ambulatory Visit: Payer: Self-pay

## 2020-12-09 VITALS — BP 116/78 | HR 67 | Ht 64.0 in | Wt 140.0 lb

## 2020-12-09 DIAGNOSIS — G245 Blepharospasm: Secondary | ICD-10-CM

## 2020-12-09 NOTE — Progress Notes (Unsigned)
**  Xeomin 50 units x 1 vial, [place] 0259-1605-01. Lot [id number], Exp 02/2022, specialty pharmacy.//mck,rn**

## 2020-12-09 NOTE — Progress Notes (Signed)
PATIENT: Christine Lozano DOB: 1956-04-06  Chief Complaint  Patient presents with  . Blepharospasm    Xeomin     HISTORICAL  Christine Lozano is a 65 year old female, seen in request by her ophthalmologist Dr. Calvert Cantor and her primary care physician Dr., [name] for evaluation of bilateral facial muscle spasm, initial evaluation was on October 21, 2019.  I have reviewed and summarized the referring note from the referring physician.  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 She did not have MRI of the brain due to cost concerns,  UPDATE March 06 2020: She only has mild transient improvement with previously EMG guided Xeomin injection for her bilateral blepharospasm, there was no significant side effect noted.  UPDATE June 02 2020: She reported mild improvement with previous injection, no significant side effect noted  UPDATE Sep 02 2020: She denies significant improvement with previous injection, there was no significant side effect noted.  Update December 09, 2020: She responded to previous injection, but short lasting, began to notice recurrent eye blinking after 2 months   REVIEW OF SYSTEMS: Full 14 system review of systems performed and notable only for as above All other review of systems were negative.  ALLERGIES: No Known Allergies  HOME  MEDICATIONS: Current Outpatient Medications  Medication Sig Dispense Refill  . amLODipine (NORVASC) 5 MG tablet Take 1 tablet by mouth daily.    Marland Kitchen incobotulinumtoxinA (XEOMIN) 50 units SOLR injection Inject 50 Units into the muscle every 3 (three) months. 1 each 3   No current facility-administered medications for this visit.    PAST MEDICAL HISTORY: Past Medical History:  Diagnosis Date  . Facial twitching   . Hypertension     PAST SURGICAL HISTORY: Past Surgical History:  Procedure Laterality Date  . BREAST CYST EXCISION Left 7829,5621   x2  . BUNIONECTOMY Left 10 years ago  . TOOTH EXTRACTION      FAMILY HISTORY: Family History  Problem Relation Age of Onset  . Cancer Mother   . Diabetes Father   . Colon cancer Neg Hx     SOCIAL HISTORY: Social History   Socioeconomic History  . Marital status: Married    Spouse name: Not on file  . Number of children: 2  . Years of education: college  . Highest education level: Master's degree (e.g., MA, MS, MEng, MEd, MSW, MBA)  Occupational History  . Occupation: Retired  Tobacco Use  . Smoking status: Never Smoker  . Smokeless tobacco: Never Used  Vaping Use  . Vaping Use: Never used  Substance and Sexual Activity  . Alcohol use: No  . Drug use: No  . Sexual activity: Not on file  Other Topics Concern  . Not on file  Social History Narrative   Lives at home with her husband.   Left-handed.   No daily caffeine use.   Social Determinants of Radio broadcast assistant  Strain: Not on file  Food Insecurity: Not on file  Transportation Needs: Not on file  Physical Activity: Not on file  Stress: Not on file  Social Connections: Not on file  Intimate Partner Violence: Not on file     PHYSICAL EXAM   Vitals:   12/09/20 1539  BP: 116/78  Pulse: 67  Weight: 140 lb (63.5 kg)  Height: 5\' 4"  (1.626 m)   Not recorded     Body mass index is 24.03 kg/m.  PHYSICAL EXAMNIATION: She has frequent forceful  bilateral eyes closing, most movement  DIAGNOSTIC DATA (LABS, IMAGING, TESTING) - I reviewed patient records, labs, notes, testing and imaging myself where available.   ASSESSMENT AND PLAN  Christine Lozano is a 65 y.o. female   Blepharospasm  EMG guided Xeomin injection, Xeomin 50 units,   Right orbicularis oculi at 2, 4, 5, 6, 7, 8, 9:00 (2.5 units each x 7=17.5 units) Left orbicularis oculi at 3, 4, 5, 6, 7, 8,9 =17.5 units  Right corrugate 5 units Left corrugate 5 units Prosperous 5 units   Marcial Pacas, M.D. Ph.D.  [facility] [address] [place], [place] [address] Ph: [phone] Fax: [phone]  CC: Calvert Cantor, MD

## 2021-01-25 ENCOUNTER — Other Ambulatory Visit: Payer: Self-pay | Admitting: Obstetrics and Gynecology

## 2021-01-25 DIAGNOSIS — R928 Other abnormal and inconclusive findings on diagnostic imaging of breast: Secondary | ICD-10-CM

## 2021-02-09 ENCOUNTER — Ambulatory Visit: Payer: BC Managed Care – PPO

## 2021-02-09 ENCOUNTER — Other Ambulatory Visit: Payer: Self-pay

## 2021-02-09 ENCOUNTER — Ambulatory Visit
Admission: RE | Admit: 2021-02-09 | Discharge: 2021-02-09 | Disposition: A | Discharge status: Home / Self Care | Payer: BC Managed Care – PPO | Source: Ambulatory Visit | Attending: Obstetrics and Gynecology | Admitting: Obstetrics and Gynecology

## 2021-02-09 DIAGNOSIS — R928 Other abnormal and inconclusive findings on diagnostic imaging of breast: Secondary | ICD-10-CM

## 2021-03-02 ENCOUNTER — Telehealth: Payer: Self-pay | Admitting: Neurology

## 2021-03-02 NOTE — Telephone Encounter (Signed)
Spoke with patient. We rescheduled her appointment to 4/26. I advised that I would call her with any cancellations. Patient confirmed she has Medicare and Tricare. I advised that we will not be using [facility] under this insurance.

## 2021-03-02 NOTE — Telephone Encounter (Signed)
Patient is scheduled for Xeomin on 4/20 with Dr. Krista Blue. I called patient and LVM to reschedule because Dr. Krista Blue will be out of office. I requested patient return my call.  I also received a fax from [facility] stating that patient must be B/B for Xeomin due to insurance change. She had BCBS but now has Medicare A & B as well as Tricare. I will also verify this with patient when she returns my call.

## 2021-03-10 ENCOUNTER — Ambulatory Visit: Payer: Self-pay | Admitting: Neurology

## 2021-03-16 ENCOUNTER — Telehealth: Payer: Self-pay | Admitting: Neurology

## 2021-03-16 ENCOUNTER — Ambulatory Visit (INDEPENDENT_AMBULATORY_CARE_PROVIDER_SITE_OTHER): Payer: Medicare Other | Admitting: Neurology

## 2021-03-16 ENCOUNTER — Encounter: Payer: Self-pay | Admitting: Neurology

## 2021-03-16 VITALS — BP 121/70 | HR 63 | Ht 64.0 in | Wt 139.0 lb

## 2021-03-16 DIAGNOSIS — G245 Blepharospasm: Secondary | ICD-10-CM | POA: Diagnosis not present

## 2021-03-16 DIAGNOSIS — G244 Idiopathic orofacial dystonia: Secondary | ICD-10-CM

## 2021-03-16 MED ORDER — INCOBOTULINUMTOXINA 50 UNITS IM SOLR
50.0000 [IU] | INTRAMUSCULAR | Status: DC
  Administered 2021-03-16: 50 [IU] via INTRAMUSCULAR

## 2021-03-16 MED ORDER — TRIHEXYPHENIDYL HCL 2 MG PO TABS: 2.0000 mg | ORAL_TABLET | Freq: Three times a day (TID) | ORAL | 3 refills | Status: DC

## 2021-03-16 NOTE — Progress Notes (Signed)
PATIENT: Christine Lozano DOB: October 15, 1956  Chief Complaint  Patient presents with  . Procedure    Blepharospasm of both eyes - Xeomin     HISTORICAL  Christine Lozano is a 65 year old female, seen in request by her ophthalmologist Dr. Calvert Cantor and her primary care physician Dr., Christine Lozano for evaluation of bilateral facial muscle spasm, initial evaluation was on October 21, 2019.  I have reviewed and summarized the referring note from the referring physician.   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, she noticed occasionally bilateral around eye muscle spasm, forceful eye closure, difficulty opening, it is gradually getting worse, become more frequent, present 50% of her day, she has quit driving, she enjoys oil painting, it does not affect her painting, sometimes she has such forceful eye closure, difficulty opening her eyes, she has to go to bed early  She denies chewing difficulty, no vocal tics, no limb muscle involvement  We began EMG guided Xeomin injection since January 2021, received a low dose mainly around orbicularis oculi on consecutive injections on April 16, July 13, September 02, 2020, December 10, 2019, and the most recent second injection was on March 16, 2021  Each time she came back was not sure about the benefit of injection, there was also noticeable facial grimace, tightening of platysmas muscle, during interview,  Patient denies significant mood disorder, or functional limitation, she is the caregiver of her 39 year old father, but she denied difficulty handling her daily activity, denies depression anxiety, has never tried medication treatment.  She prefers academic referral,   REVIEW OF SYSTEMS: Full 14 system review of systems performed and notable only for as above All other review of systems were negative.  ALLERGIES: No Known Allergies  HOME MEDICATIONS: Current Outpatient  Medications  Medication Sig Dispense Refill  . amLODipine (NORVASC) 5 MG tablet Take 1 tablet by mouth daily.    Marland Kitchen incobotulinumtoxinA (XEOMIN) 50 units SOLR injection Inject 50 Units into the muscle every 3 (three) months. 1 each 3   No current facility-administered medications for this visit.    PAST MEDICAL HISTORY: Past Medical History:  Diagnosis Date  . Facial twitching   . Hypertension     PAST SURGICAL HISTORY: Past Surgical History:  Procedure Laterality Date  . BREAST CYST EXCISION Left [id number],[id number]   x2  . BUNIONECTOMY Left 10 years ago  . TOOTH EXTRACTION      FAMILY HISTORY: Family History  Problem Relation Age of Onset  . Cancer Mother   . Diabetes Father   . Colon cancer Neg Hx     SOCIAL HISTORY: Social History   Socioeconomic History  . Marital status: Married    Spouse name: Not on file  . Number of children: 2  . Years of education: college  . Highest education level: Master's degree (e.g., MA, MS, MEng, MEd, MSW, MBA)  Occupational History  . Occupation: Retired  Tobacco Use  . Smoking status: Never Smoker  . Smokeless tobacco: Never Used  Vaping Use  . Vaping Use: Never used  Substance and Sexual Activity  . Alcohol use: No  . Drug use: No  . Sexual activity: Not on file  Other Topics Concern  . Not on file  Social History Narrative   Lives at home with her husband.   Left-handed.   No daily caffeine use.   Social Determinants of Health   Financial Resource Strain: Not  on file  Food Insecurity: Not on file  Transportation Needs: Not on file  Physical Activity: Not on file  Stress: Not on file  Social Connections: Not on file  Intimate Partner Violence: Not on file     PHYSICAL EXAM   Vitals:   03/16/21 0910  BP: 121/70  Pulse: 63  Weight: 139 lb (63 kg)  Height: 5\' 4"  (1.626 m)   Not recorded     Body mass index is 23.86 kg/m.  PHYSICAL EXAMNIATION:   PHYSICAL EXAMNIATION:  Gen: NAD, conversant, well  nourised, well groomed                     Cardiovascular: Regular rate rhythm, no peripheral edema, warm, nontender. Eyes: Conjunctivae clear without exudates or hemorrhage Neck: Supple, no carotid bruits. Pulmonary: Clear to auscultation bilaterally   NEUROLOGICAL EXAM:  MENTAL STATUS: Speech/Cognition: Awake, alert, normal speech, oriented to history taking and casual conversation.  CRANIAL NERVES: She has frequent eye blinking, facial grimace tightening of the platysmas muscle, CN II: Visual fields are full to confrontation.  Pupils are round equal and briskly reactive to light. CN III, IV, VI: extraocular movement are normal. No ptosis. CN V: Facial sensation is intact to light touch. CN VII: Face is symmetric with normal eye closure and smile. CN VIII: Hearing is normal to casual conversation CN IX, X: Palate elevates symmetrically. Phonation is normal. CN XI: Head turning and shoulder shrug are intact   MOTOR: Muscle bulk and tone are normal. Muscle strength is normal.  REFLEXES: Reflexes are 2  and symmetric at the biceps, triceps, knees and ankles. Plantar responses are flexor.  SENSORY: Intact to light touch, pinprick, positional and vibratory sensation at fingers and toes.  COORDINATION: There is no trunk or limb ataxia.    GAIT/STANCE: Posture is normal. Gait is steady with normal steps, base, arm swing and turning.   DIAGNOSTIC DATA (LABS, IMAGING, TESTING) - I reviewed patient records, labs, notes, testing and imaging myself where available.   ASSESSMENT AND PLAN  Christine Lozano is a 65 y.o. female   Meige syndrome  Suboptimal response to EMG guided low-dose Xeomin injection,  Will try low-dose artane 2mg  tid.  Refer to [facility]   EMG guided Xeomin injection, Xeomin 50 units, used 37.5 units, discard 12.5 units Right orbicularis oculi at 2, 4, 5, 6, 7, 8,(2.5 units each x 6=15units) Left orbicularis oculi at 3, 4, 5, 6, 7, 8 =15  units  Right corrugate 2.5 units Left corrugate 2.5 units Prosperous 2.5 units   Marcial Pacas, M.D. Ph.D.  [facility] [address], [place], [place] [address] Ph: [phone] Fax: [phone]  CC: Calvert Cantor, MD

## 2021-03-16 NOTE — Telephone Encounter (Signed)
Left message requesting a return call.

## 2021-03-16 NOTE — Telephone Encounter (Signed)
Please let patient know, I have referred her to [facility], with a phone number as below, on the website, they also talk about telemetry options at [facility]  I also started Artane 2 mg 3 times daily, potential side effect listed, she may started at low dose half tablets 3 times a day, gradually titrating up the dosage,      Gastrointestinal: Nausea (30% to 50% .), Xerostomia (30% to 50% )  Neurologic: Dizziness (30% to 50% )  Ophthalmic: Blurred vision (30% to 50% )  Psychiatric: Feeling nervous (30% to 50% )   [phone] to schedule an appointment.  Click here to learn more about Telehealth options at [facility].

## 2021-03-16 NOTE — Telephone Encounter (Signed)
She is aware to expect a call from [facility] to get scheduled w/ the movement disorder clinic.  She was in agreement to try Artane 2mg , one tab TID. She verbalized understanding to start at half dose TID and slowly titrate up to minimize side effects.

## 2021-03-16 NOTE — Progress Notes (Signed)
**  Xeomin 50 units x 1, NDC 0259-1605-01, Lot [id number], Exp 02/2022, office supply.//mck,rn**

## 2021-06-16 ENCOUNTER — Ambulatory Visit: Payer: Medicare Other | Admitting: Neurology

## 2021-07-20 ENCOUNTER — Other Ambulatory Visit: Payer: Self-pay | Admitting: Radiology

## 2021-07-20 DIAGNOSIS — N6310 Unspecified lump in the right breast, unspecified quadrant: Secondary | ICD-10-CM

## 2021-07-29 ENCOUNTER — Ambulatory Visit
Admission: RE | Admit: 2021-07-29 | Discharge: 2021-07-29 | Disposition: A | Discharge status: Home / Self Care | Payer: Medicare Other | Source: Ambulatory Visit | Attending: Radiology | Admitting: Radiology

## 2021-07-29 ENCOUNTER — Other Ambulatory Visit: Payer: Self-pay

## 2021-07-29 ENCOUNTER — Other Ambulatory Visit: Payer: Self-pay | Admitting: Radiology

## 2021-07-29 DIAGNOSIS — N6310 Unspecified lump in the right breast, unspecified quadrant: Secondary | ICD-10-CM

## 2021-08-04 ENCOUNTER — Other Ambulatory Visit: Payer: Self-pay

## 2021-08-04 ENCOUNTER — Ambulatory Visit
Admission: RE | Admit: 2021-08-04 | Discharge: 2021-08-04 | Disposition: A | Discharge status: Home / Self Care | Payer: Medicare Other | Source: Ambulatory Visit | Attending: Radiology | Admitting: Radiology

## 2021-08-04 ENCOUNTER — Other Ambulatory Visit: Payer: Self-pay | Admitting: Radiology

## 2021-08-04 DIAGNOSIS — N6310 Unspecified lump in the right breast, unspecified quadrant: Secondary | ICD-10-CM

## 2021-08-04 HISTORY — PX: BREAST BIOPSY: SHX20

## 2021-08-05 ENCOUNTER — Other Ambulatory Visit: Payer: Self-pay | Admitting: Radiology

## 2021-08-05 DIAGNOSIS — Z170 Estrogen receptor positive status [ER+]: Secondary | ICD-10-CM

## 2021-08-05 DIAGNOSIS — C50911 Malignant neoplasm of unspecified site of right female breast: Secondary | ICD-10-CM

## 2021-08-05 DIAGNOSIS — R928 Other abnormal and inconclusive findings on diagnostic imaging of breast: Secondary | ICD-10-CM

## 2021-08-06 ENCOUNTER — Other Ambulatory Visit: Payer: Self-pay | Admitting: Radiology

## 2021-08-06 DIAGNOSIS — C50911 Malignant neoplasm of unspecified site of right female breast: Secondary | ICD-10-CM

## 2021-08-07 ENCOUNTER — Ambulatory Visit
Admission: RE | Admit: 2021-08-07 | Discharge: 2021-08-07 | Disposition: A | Discharge status: Home / Self Care | Payer: Medicare Other | Source: Ambulatory Visit | Attending: Radiology | Admitting: Radiology

## 2021-08-07 ENCOUNTER — Other Ambulatory Visit: Payer: Self-pay

## 2021-08-07 DIAGNOSIS — C50911 Malignant neoplasm of unspecified site of right female breast: Secondary | ICD-10-CM

## 2021-08-07 MED ORDER — GADOBUTROL 1 MMOL/ML IV SOLN
6.0000 mL | Freq: Once | INTRAVENOUS | Status: AC | PRN
  Administered 2021-08-07: 6 mL via INTRAVENOUS

## 2021-08-09 ENCOUNTER — Encounter: Payer: Self-pay | Admitting: *Deleted

## 2021-08-09 ENCOUNTER — Telehealth: Payer: Self-pay | Admitting: Hematology and Oncology

## 2021-08-09 ENCOUNTER — Ambulatory Visit
Admission: RE | Admit: 2021-08-09 | Discharge: 2021-08-09 | Disposition: A | Discharge status: Home / Self Care | Payer: Medicare Other | Source: Ambulatory Visit | Attending: Radiology | Admitting: Radiology

## 2021-08-09 ENCOUNTER — Other Ambulatory Visit: Payer: Self-pay

## 2021-08-09 DIAGNOSIS — Z170 Estrogen receptor positive status [ER+]: Secondary | ICD-10-CM

## 2021-08-09 DIAGNOSIS — C50911 Malignant neoplasm of unspecified site of right female breast: Secondary | ICD-10-CM

## 2021-08-09 DIAGNOSIS — I10 Essential (primary) hypertension: Secondary | ICD-10-CM | POA: Insufficient documentation

## 2021-08-09 HISTORY — DX: Personal history of irradiation: Z92.3

## 2021-08-09 NOTE — Telephone Encounter (Signed)
Left message for patient to return my call in reference to another message stating there was a conflict with upcoming appointment, left my contact for patient to return call to confirm if she is still coming to appointment on Wednesday

## 2021-08-09 NOTE — Telephone Encounter (Signed)
Patient called back to confirm she will be here Wednesday for clinic appointment, she was just confused on location of appointment

## 2021-08-09 NOTE — Telephone Encounter (Signed)
Spoke to patient to confirm afternoon clinic appointment, packet sent via email

## 2021-08-10 ENCOUNTER — Other Ambulatory Visit: Payer: Self-pay

## 2021-08-10 ENCOUNTER — Encounter: Payer: Self-pay | Admitting: *Deleted

## 2021-08-10 DIAGNOSIS — C50311 Malignant neoplasm of lower-inner quadrant of right female breast: Secondary | ICD-10-CM | POA: Insufficient documentation

## 2021-08-10 DIAGNOSIS — C50919 Malignant neoplasm of unspecified site of unspecified female breast: Secondary | ICD-10-CM

## 2021-08-10 NOTE — Progress Notes (Signed)
Radiation Oncology         [phone] ________________________________  Initial Outpatient Consultation  Name: Christine Lozano MRN: [id number]  Date: 08/11/2021  DOB: 06-01-1956  YQ:IHKVQ, Mechele Claude, MD  Jovita Kussmaul, MD   REFERRING PHYSICIAN: Autumn Messing III, MD  DIAGNOSIS:    ICD-10-CM   1. Malignant neoplasm of lower-inner quadrant of right breast of female, estrogen receptor positive ([place])  C50.311    Z17.0      Cancer Staging Malignant neoplasm of lower-inner quadrant of right female breast [facility]) Staging form: Breast, AJCC 8th Edition - Clinical: Stage IB (cT2, cN0, cM0, G2, ER+, PR+, HER2-) - Signed by Nicholas Lose, MD on 08/11/2021 Histologic grading system: 3 grade system  Stage IB (cT2, cN0, cM0) Right Breast LIQ Invasive Ductal Carcinoma, ER+ / PR+ / Her2-, Grade 2  CHIEF COMPLAINT: Here to discuss management of right breast cancer  HISTORY OF PRESENT ILLNESS::Christine Lozano is a 65 y.o. female who has a history of right breast cancer treated with lumpectomy and radiation approximately around 2004 at [facility]. Per records (mammogram report,) "The patient underwent lumpectomy and radiation therapy for right breast cancer in 2000 and 2001.  She had a re-excision of the lumpectomy site in 2004."  She believes she received RT to the breast only, for 6 wks.  She believes she had Stage I or II cancer.  The patient recently presented with new breast abnormality defined by a 1 cm irregular lower inner right breast mass on the following imaging: diagnostic right mammography with tomo and cad and right breast ultrasound on the date of 07/29/21.  Symptoms, if any, at that time, were: palpable thickening of the retroareolar right breast.  Right breast needle core biopsy at the 4 o'clock position on date of 08/04/21 showed invasive ductal carcinoma; measuring 0.9 cm in the greatest linear extent.  ER status: +; PR status +, Her2 status equivocal pending further testing; Grade 2.    Bilateral breast MRI performed on 08/07/21 demonstrated the biopsy proven recurrent malignancy in the retroareolar right breast measuring up to 2.1 cm. An additional suspicious irregular enhancing mass, measuring up to 1.2 cm, was also visualized in the upper, slightly inner right breast at a posterior depth. No evidence of malignancy was seen in the left breast.  Diagnostic left mammogram performed on 08/09/21 confirmed no evidence of malignancy in the left breast.   She is active in artistic activities - drama, gospel signing, painting.  PREVIOUS RADIATION THERAPY: Yes, history of right breast cancer treated with radiation in 2004 as above.  PAST MEDICAL HISTORY:  has a past medical history of Facial twitching, Hypertension, and Personal history of radiation therapy.    PAST SURGICAL HISTORY: Past Surgical History:  Procedure Laterality Date   BREAST BIOPSY Right 08/04/2021   BREAST CYST EXCISION Left [id number]   x2   BREAST LUMPECTOMY     BUNIONECTOMY Left 10 years ago   TOOTH EXTRACTION      FAMILY HISTORY: family history includes Cancer in her mother; Diabetes in her father.  SOCIAL HISTORY:  reports that she has never smoked. She has never used smokeless tobacco. She reports that she does not drink alcohol and does not use drugs.  ALLERGIES: Patient has no known allergies.  MEDICATIONS:  Current Outpatient Medications  Medication Sig Dispense Refill   amLODipine (NORVASC) 5 MG tablet Take 1 tablet by mouth daily.     incobotulinumtoxinA (XEOMIN) 50 units SOLR injection Inject 50 Units into the  muscle every 3 (three) months. 1 each 3   trihexyphenidyl (ARTANE) 2 MG tablet Take 1 tablet (2 mg total) by mouth 3 (three) times daily with meals. 90 tablet 3   Current Facility-Administered Medications  Medication Dose Route Frequency Provider Last Rate Last Admin   incobotulinumtoxinA (XEOMIN) 50 units injection 50 Units  50 Units Intramuscular Q90 days Marcial Pacas, MD   50 Units  at 03/16/21 0945    REVIEW OF SYSTEMS: As above   PHYSICAL EXAM:  vitals were not taken for this visit.   General: Alert and oriented, in no acute distress Heart: Regular in rate and rhythm with no murmurs, rubs, or gallops. Chest: Clear to auscultation bilaterally, with no rhonchi, wheezes, or rales. Extremities: No cyanosis or edema. Skin: No concerning lesions. Musculoskeletal: symmetric strength and muscle tone throughout. Neurologic: Cranial nerves II through XII are grossly intact. No obvious focalities. Speech is fluent. Coordination is intact. Psychiatric: Judgment and insight are intact. Affect is appropriate. Breasts: firmness/thickening in lower outer right breast and tissue concavity/tenderness in lower inner right breast. No other palpable masses appreciated in the breasts or axillae bilaterally.  ECOG = 0  0 - Asymptomatic (Fully active, able to carry on all predisease activities without restriction)  1 - Symptomatic but completely ambulatory (Restricted in physically strenuous activity but ambulatory and able to carry out work of a light or sedentary nature. For example, light housework, office work)  2 - Symptomatic, <50% in bed during the day (Ambulatory and capable of all self care but unable to carry out any work activities. Up and about more than 50% of waking hours)  3 - Symptomatic, >50% in bed, but not bedbound (Capable of only limited self-care, confined to bed or chair 50% or more of waking hours)  4 - Bedbound (Completely disabled. Cannot carry on any self-care. Totally confined to bed or chair)  5 - Death   Eustace Pen MM, Creech RH, Tormey DC, et al. [phone]). "Toxicity and response criteria of the [facility]". [place] Oncol. 5 (6): 649-55   LABORATORY DATA:  Lab Results  Component Value Date   WBC 4.0 08/11/2021   HGB 12.8 08/11/2021   HCT 38.8 08/11/2021   MCV 83.4 08/11/2021   PLT 223 08/11/2021   CMP     Component Value  Date/Time   NA 142 08/11/2021 1234   K 4.1 08/11/2021 1234   CL 106 08/11/2021 1234   CO2 27 08/11/2021 1234   GLUCOSE 93 08/11/2021 1234   BUN 12 08/11/2021 1234   CREATININE 0.86 08/11/2021 1234   CALCIUM 9.3 08/11/2021 1234   PROT 7.6 08/11/2021 1234   ALBUMIN 4.2 08/11/2021 1234   AST 20 08/11/2021 1234   ALT 13 08/11/2021 1234   ALKPHOS 87 08/11/2021 1234   BILITOT 0.5 08/11/2021 1234   GFRNONAA >60 08/11/2021 1234         RADIOGRAPHY: MR BREAST BILATERAL W WO CONTRAST INC CAD  Result Date: 08/09/2021 CLINICAL DATA:  65 year old female recently diagnosed with recurrent right breast cancer. LABS:  None performed on site. EXAM: BILATERAL BREAST MRI WITH AND WITHOUT CONTRAST TECHNIQUE: Multiplanar, multisequence MR images of both breasts were obtained prior to and following the intravenous administration of 6 ml of Gadavist. Three-dimensional MR images were rendered by post-processing of the original MR data on an independent workstation. The three-dimensional MR images were interpreted, and findings are reported in the following complete MRI report for this study. Three dimensional images were  evaluated at the independent interpreting workstation using the DynaCAD thin client. COMPARISON:  Previous exam(s). FINDINGS: Breast composition: b. Scattered fibroglandular tissue. Background parenchymal enhancement: Mild. Right breast: Note is made of postsurgical changes throughout the central right breast. Susceptibility artifact from post biopsy marking clip is seen in association with an irregular, enhancing mass in the retroareolar right breast (series 6, image 75/144). This is consistent with the patient's site of biopsy proven malignancy. It measures 2.1 x 1.2 x 1.2 cm. A second, similar area of irregular enhancement is seen in the upper, slightly inner aspect of the right breast at posterior depth (series 6, image 46/144). It measures 1.2 x 1.0 x 1.0 cm. No additional suspicious enhancement  throughout the remainder of the right breast. Left breast: No suspicious mass or abnormal enhancement. Lymph nodes: No abnormal appearing lymph nodes. Ancillary findings:  None. IMPRESSION: 1. Biopsy-proven, recurrent malignancy in the retroareolar right breast measuring up to 2.1 cm. 2. Additional, suspicious irregular enhancing mass measuring up to 1.2 cm in the upper, slightly inner right breast at posterior depth (series 6, image 46). 3. No MRI evidence of malignancy on the left. 4. No suspicious lymphadenopathy. RECOMMENDATION: Additional image guided biopsy of the second site of suspicious enhancement in the upper inner right breast can be performed if this will alter clinical management. If so, evaluation with second-look ultrasound and possible ultrasound-guided biopsy can be attempted. If no sonographic correlate is identified, then MRI guided biopsy can be performed. BI-RADS CATEGORY  4: Suspicious. Electronically Signed   By: Kristopher Oppenheim M.D.   On: 08/09/2021 11:52  US BREAST LTD UNI RIGHT INC AXILLA  Result Date: 07/29/2021 CLINICAL DATA:  65 year old female with focal pain and palpable thickening in the RETROAREOLAR RIGHT breast, which has now improved. Patient has a history of RIGHT breast cancer and lumpectomy in 2001. EXAM: DIGITAL DIAGNOSTIC UNILATERAL RIGHT MAMMOGRAM WITH TOMOSYNTHESIS AND CAD; ULTRASOUND RIGHT BREAST LIMITED TECHNIQUE: Right digital diagnostic mammography and breast tomosynthesis was performed. The images were evaluated with computer-aided detection.; Targeted ultrasound examination of the right breast was performed COMPARISON:  Previous exam(s). ACR Breast Density Category b: There are scattered areas of fibroglandular density. FINDINGS: 2D/3D full field and spot compression views of the RIGHT breast demonstrate lumpectomy changes within the RETROAREOLAR/central RIGHT breast. No new suspicious mammographic findings are noted within the RIGHT breast. On physical exam,  palpable thickening at the 4 o'clock position of the RIGHT breast 1 cm from the nipple is identified. Targeted ultrasound is performed, showing a 0.6 x 1 x 0.6 cm irregular hypoechoic mass at the 4 o'clock position of the RIGHT breast 1 cm from the nipple. No abnormal RIGHT axillary lymph nodes are noted. IMPRESSION: 1. 1 cm irregular LOWER INNER RIGHT breast mass. Tissue sampling is recommended. 2. No abnormal appearing RIGHT axillary lymph nodes. RECOMMENDATION: Ultrasound-guided RIGHT breast biopsy, which will be scheduled. I have discussed the findings and recommendations with the patient. If applicable, a reminder letter will be sent to the patient regarding the next appointment. BI-RADS CATEGORY  4: Suspicious. Electronically Signed   By: Margarette Canada M.D.   On: 07/29/2021 14:28  MM DIAG BREAST TOMO UNI LEFT  Result Date: 08/09/2021 CLINICAL DATA:  Recent diagnosis right breast cancer. EXAM: DIGITAL DIAGNOSTIC UNILATERAL LEFT MAMMOGRAM WITH TOMOSYNTHESIS AND CAD TECHNIQUE: Left digital diagnostic mammography and breast tomosynthesis was performed. The images were evaluated with computer-aided detection. COMPARISON:  Previous exam(s). ACR Breast Density Category b: There are scattered areas of fibroglandular density.  FINDINGS: Scarring is again seen in the upper-outer left breast. An asymmetry in the superior left breast resolves on additional imaging. No suspicious or new masses comparing to multiple previous studies. No evidence of malignancy on the left. IMPRESSION: No mammographic evidence of malignancy on the left. RECOMMENDATION: Recommend continued surgical and oncologic follow-up for the known right breast cancer. The patient has a breast MRI pending. I have discussed the findings and recommendations with the patient. If applicable, a reminder letter will be sent to the patient regarding the next appointment. BI-RADS CATEGORY  2: Benign. Electronically Signed   By: Dorise Bullion III M.D.   On:  08/09/2021 11:41  MM DIAG BREAST TOMO UNI RIGHT  Result Date: 07/29/2021 CLINICAL DATA:  65 year old female with focal pain and palpable thickening in the RETROAREOLAR RIGHT breast, which has now improved. Patient has a history of RIGHT breast cancer and lumpectomy in 2001. EXAM: DIGITAL DIAGNOSTIC UNILATERAL RIGHT MAMMOGRAM WITH TOMOSYNTHESIS AND CAD; ULTRASOUND RIGHT BREAST LIMITED TECHNIQUE: Right digital diagnostic mammography and breast tomosynthesis was performed. The images were evaluated with computer-aided detection.; Targeted ultrasound examination of the right breast was performed COMPARISON:  Previous exam(s). ACR Breast Density Category b: There are scattered areas of fibroglandular density. FINDINGS: 2D/3D full field and spot compression views of the RIGHT breast demonstrate lumpectomy changes within the RETROAREOLAR/central RIGHT breast. No new suspicious mammographic findings are noted within the RIGHT breast. On physical exam, palpable thickening at the 4 o'clock position of the RIGHT breast 1 cm from the nipple is identified. Targeted ultrasound is performed, showing a 0.6 x 1 x 0.6 cm irregular hypoechoic mass at the 4 o'clock position of the RIGHT breast 1 cm from the nipple. No abnormal RIGHT axillary lymph nodes are noted. IMPRESSION: 1. 1 cm irregular LOWER INNER RIGHT breast mass. Tissue sampling is recommended. 2. No abnormal appearing RIGHT axillary lymph nodes. RECOMMENDATION: Ultrasound-guided RIGHT breast biopsy, which will be scheduled. I have discussed the findings and recommendations with the patient. If applicable, a reminder letter will be sent to the patient regarding the next appointment. BI-RADS CATEGORY  4: Suspicious. Electronically Signed   By: Margarette Canada M.D.   On: 07/29/2021 14:28  MM CLIP PLACEMENT RIGHT  Result Date: 08/04/2021 CLINICAL DATA:  Evaluate biopsy marker EXAM: 3D DIAGNOSTIC RIGHT MAMMOGRAM POST ULTRASOUND BIOPSY COMPARISON:  Previous exam(s). FINDINGS:  3D Mammographic images were obtained following ultrasound guided biopsy of a 4 o'clock right breast mass. The biopsy marking clip is in expected position at the site of biopsy. IMPRESSION: Appropriate positioning of the type shaped biopsy marking clip at the site of biopsy in the region of the biopsied right breast mass. Final Assessment: Post Procedure Mammograms for Marker Placement Electronically Signed   By: Dorise Bullion III M.D.   On: 08/04/2021 08:42  [place] RT BREAST BX W LOC DEV 1ST LESION IMG BX SPEC US GUIDE  Addendum Date: 08/09/2021   ADDENDUM REPORT: 08/06/2021 13:22 ADDENDUM: Pathology revealed GRADE II INVASIVE DUCTAL CARCINOMA of the RIGHT breast, 4:00 o'clock, ribbon clip. This was found to be concordant by Dr. Dorise Bullion. Pathology results were discussed with the patient by telephone. The patient reported doing well after the biopsy with tenderness at the site. Post biopsy instructions and care were reviewed and questions were answered. The patient was encouraged to call [facility] for any additional concerns. Due to these findings, patient is scheduled for LEFT diagnostic mammography on August 09, 2021 and breast MRI on  August 07, 2021. The patient was referred to [facility] on August 11, 2021. Pathology results reported by Stacie Acres RN on 08/06/2021. Electronically Signed   By: Dorise Bullion III M.D.   On: 08/06/2021 13:22   Result Date: 08/09/2021 CLINICAL DATA:  Biopsy of a 4 o'clock right breast mass EXAM: ULTRASOUND GUIDED RIGHT BREAST CORE NEEDLE BIOPSY COMPARISON:  Previous exam(s). PROCEDURE: I met with the patient and we discussed the procedure of ultrasound-guided biopsy, including benefits and alternatives. We discussed the high likelihood of a successful procedure. We discussed the risks of the procedure, including infection, bleeding, tissue injury, clip  migration, and inadequate sampling. Informed written consent was given. The usual time-out protocol was performed immediately prior to the procedure. Lesion quadrant: 4 o'clock right breast Using sterile technique and 1% Lidocaine as local anesthetic, under direct ultrasound visualization, a 14 gauge spring-loaded device was used to perform biopsy of a 4 o'clock right breast mass using a lateral approach. At the conclusion of the procedure a ribbon shaped tissue marker clip was deployed into the biopsy cavity. Follow up 2 view mammogram was performed and dictated separately. IMPRESSION: Ultrasound guided biopsy of a 4 o'clock right breast mass. No apparent complications. Electronically Signed: By: Dorise Bullion III M.D. On: 08/04/2021 08:27     IMPRESSION/PLAN: This is a very nice 65 year old patient with previous history of breast conserving surgery and radiation to the right breast, now with a new diagnosis of right breast cancer.  Given her previous history of radiation therapy, the multidisciplinary team and I recommend that she pursue mastectomy.  She is eager to move forward with that.  She well also have a sentinel lymph node biopsy.  She understands that if any of her nodes are positive she may need to undergo axillary lymph node dissection.  In the meantime I will try to get her outside records (radiation plan) though it may no longer be available giving that her treatment was approximately 18 years ago.  At this point I do not anticipate adjuvant radiation therapy postmastectomy but I will certainly speak with Dr. Marlou Starks at tumor board if her disease is more advanced than it appears on imaging.  She is left-handed and she is amenable to a right axillary lymph node dissection if necessary.    On date of service, in total, I spent 45 minutes on this encounter. Patient was seen in person.   __________________________________________   Eppie Gibson, MD  This document serves as a record of services  personally performed by Eppie Gibson, MD. It was created on her behalf by Roney Mans, a trained medical scribe. The creation of this record is based on the scribe's personal observations and the provider's statements to them. This document has been checked and approved by the attending provider.

## 2021-08-10 NOTE — Progress Notes (Signed)
[place] NOTE  Patient Care Team: Fanny Bien, MD as PCP - General (Family Medicine) Calvert Cantor, MD as Consulting Physician (Ophthalmology) Jovita Kussmaul, MD as Consulting Physician (General Surgery) Nicholas Lose, MD as Consulting Physician (Hematology and Oncology) Eppie Gibson, MD as Attending Physician (Radiation Oncology) Mauro Kaufmann, RN as Oncology Nurse Navigator Rockwell Germany, RN as Oncology Nurse Navigator  CHIEF COMPLAINTS/PURPOSE OF CONSULTATION:  Newly diagnosed breast cancer  HISTORY OF PRESENTING ILLNESS:  Christine Lozano 65 y.o. female is here because of recent diagnosis of invasive ductal carcinoma of the right breast. She had palpable thickening and pain in the retroareolar right breast. Diagnostic mammogram on 07/29/2021 showed 1 cm irregular lower inner right breast mass. MRI Breast 08/07/2021 showed retroareolar right breast measuring up to 2.1 cm, and a suspicious irregular enhancing mass measuring up to 1.2 cm in the upper, slightly inner right breast at posterior depth. Biopsy on 08/04/2021 showed Nottingham grade 2 to 3 invasive ductal carcinoma. She presents to the clinic today for initial evaluation and discussion of treatment options.   I reviewed her records extensively and collaborated the history with the patient.  SUMMARY OF ONCOLOGIC HISTORY: Oncology History  Malignant neoplasm of lower-inner quadrant of right female breast ([place])  08/04/2021 Initial Diagnosis   2001: Lumpectomy and radiation right breast Current: Palpable thickening in the right breast retroareolar, ultrasound showed mass at 4:00: 1 cm, axilla negative.  MRI showed 2.1 cm retroareolar mass plus additional 1.2 cm mass (not biopsied), biopsy revealed grade 2 IDC prognostic panel pending   08/11/2021 Cancer Staging   Staging form: Breast, AJCC 8th Edition - Clinical: Stage IB (cT2, cN0, cM0, G2, ER+, PR+, HER2-) - Signed by Nicholas Lose, MD on  08/11/2021 Histologic grading system: 3 grade system     MEDICAL HISTORY:  Past Medical History:  Diagnosis Date   Facial twitching    Hypertension    Personal history of radiation therapy     SURGICAL HISTORY: Past Surgical History:  Procedure Laterality Date   BREAST BIOPSY Right 08/04/2021   BREAST CYST EXCISION Left [id number]   x2   BREAST LUMPECTOMY     BUNIONECTOMY Left 10 years ago   TOOTH EXTRACTION      SOCIAL HISTORY: Social History   Socioeconomic History   Marital status: Married    Spouse name: Not on file   Number of children: 2   Years of education: college   Highest education level: Master's degree (e.g., MA, MS, MEng, MEd, MSW, MBA)  Occupational History   Occupation: Retired  Tobacco Use   Smoking status: Never   Smokeless tobacco: Never  Vaping Use   Vaping Use: Never used  Substance and Sexual Activity   Alcohol use: No   Drug use: No   Sexual activity: Not on file  Other Topics Concern   Not on file  Social History Narrative   Lives at home with her husband.   Left-handed.   No daily caffeine use.   Social Determinants of Health   Financial Resource Strain: Not on file  Food Insecurity: Not on file  Transportation Needs: Not on file  Physical Activity: Not on file  Stress: Not on file  Social Connections: Not on file  Intimate Partner Violence: Not on file    FAMILY HISTORY: Family History  Problem Relation Age of Onset   Cancer Mother    Diabetes Father    Colon cancer Neg Hx  ALLERGIES:  has No Known Allergies.  MEDICATIONS:  Current Outpatient Medications  Medication Sig Dispense Refill   amLODipine (NORVASC) 5 MG tablet Take 1 tablet by mouth daily.     incobotulinumtoxinA (XEOMIN) 50 units SOLR injection Inject 50 Units into the muscle every 3 (three) months. 1 each 3   trihexyphenidyl (ARTANE) 2 MG tablet Take 1 tablet (2 mg total) by mouth 3 (three) times daily with meals. 90 tablet 3   Current  Facility-Administered Medications  Medication Dose Route Frequency Provider Last Rate Last Admin   incobotulinumtoxinA (XEOMIN) 50 units injection 50 Units  50 Units Intramuscular Q90 days Marcial Pacas, MD   50 Units at 03/16/21 0945    REVIEW OF SYSTEMS:   Constitutional: Denies fevers, chills or abnormal night sweats   All other systems were reviewed with the patient and are negative.  PHYSICAL EXAMINATION: ECOG PERFORMANCE STATUS: 1 - Symptomatic but completely ambulatory  Vitals:   08/11/21 1248  BP: 138/80  Pulse: 67  Resp: 18  Temp: 97.7 F (36.5 C)  SpO2: 100%   Filed Weights   08/11/21 1248  Weight: 136 lb 1.6 oz (61.7 kg)   LABORATORY DATA:  I have reviewed the data as listed Lab Results  Component Value Date   WBC 4.0 08/11/2021   HGB 12.8 08/11/2021   HCT 38.8 08/11/2021   MCV 83.4 08/11/2021   PLT 223 08/11/2021   Lab Results  Component Value Date   NA 142 08/11/2021   K 4.1 08/11/2021   CL 106 08/11/2021   CO2 27 08/11/2021    RADIOGRAPHIC STUDIES: I have personally reviewed the radiological reports and agreed with the findings in the report.  ASSESSMENT AND PLAN:  Malignant neoplasm of lower-inner quadrant of right female breast ([place]) 2001: Lumpectomy and radiation right breast 08/04/2021: Palpable thickening in the right breast retroareolar, ultrasound showed mass at 4:00: 1 cm, axilla negative.  MRI showed 2.1 cm retroareolar mass plus additional 1.2 cm mass (not biopsied), biopsy revealed grade 2 IDC prognostic panel pending  Pathology and radiology counseling:Discussed with the patient, the details of pathology including the type of breast cancer,the clinical staging, the significance of ER, PR and HER-2/neu receptors and the implications for treatment. After reviewing the pathology in detail, we proceeded to discuss the different treatment options between surgery, radiation, chemotherapy, antiestrogen therapies.  Recommendations: 1.  Right  mastectomy 2. Oncotype DX to see if she would benefit from chemotherapy 3. Adjuvant antiestrogen therapy if ER positive    Return to clinic after surgery to discuss final pathology report and then determine if appropriate adjuvant therapy plan   All questions were answered. The patient knows to call the clinic with any problems, questions or concerns.   Rulon Eisenmenger, MD, MPH 08/11/2021    I, Thana Ates, am acting as scribe for Nicholas Lose, MD.  I have reviewed the above documentation for accuracy and completeness, and I agree with the above.

## 2021-08-11 ENCOUNTER — Other Ambulatory Visit: Payer: Self-pay | Admitting: *Deleted

## 2021-08-11 ENCOUNTER — Encounter: Payer: Self-pay | Admitting: General Practice

## 2021-08-11 ENCOUNTER — Ambulatory Visit
Admission: RE | Admit: 2021-08-11 | Discharge: 2021-08-11 | Disposition: A | Discharge status: Home / Self Care | Payer: Medicare Other | Source: Ambulatory Visit | Attending: Radiation Oncology | Admitting: Radiation Oncology

## 2021-08-11 ENCOUNTER — Encounter: Payer: Self-pay | Admitting: *Deleted

## 2021-08-11 ENCOUNTER — Ambulatory Visit: Payer: Self-pay | Admitting: General Surgery

## 2021-08-11 ENCOUNTER — Inpatient Hospital Stay (HOSPITAL_BASED_OUTPATIENT_CLINIC_OR_DEPARTMENT_OTHER): Payer: Medicare Other | Admitting: Hematology and Oncology

## 2021-08-11 ENCOUNTER — Ambulatory Visit: Payer: Medicare Other | Attending: General Surgery | Admitting: Physical Therapy

## 2021-08-11 ENCOUNTER — Other Ambulatory Visit: Payer: Self-pay

## 2021-08-11 ENCOUNTER — Inpatient Hospital Stay: Payer: Medicare Other

## 2021-08-11 ENCOUNTER — Encounter: Payer: Self-pay | Admitting: Physical Therapy

## 2021-08-11 DIAGNOSIS — R293 Abnormal posture: Secondary | ICD-10-CM | POA: Diagnosis present

## 2021-08-11 DIAGNOSIS — C50311 Malignant neoplasm of lower-inner quadrant of right female breast: Secondary | ICD-10-CM

## 2021-08-11 DIAGNOSIS — Z170 Estrogen receptor positive status [ER+]: Secondary | ICD-10-CM

## 2021-08-11 DIAGNOSIS — C50919 Malignant neoplasm of unspecified site of unspecified female breast: Secondary | ICD-10-CM

## 2021-08-11 LAB — CBC WITH DIFFERENTIAL (CANCER CENTER ONLY)
Abs Immature Granulocytes: 0.01 10*3/uL (ref 0.00–0.07)
Basophils Absolute: 0 10*3/uL (ref 0.0–0.1)
Basophils Relative: 1 %
Eosinophils Absolute: 0.1 10*3/uL (ref 0.0–0.5)
Eosinophils Relative: 2 %
HCT: 38.8 % (ref 36.0–46.0)
Hemoglobin: 12.8 g/dL (ref 12.0–15.0)
Immature Granulocytes: 0 %
Lymphocytes Relative: 46 %
Lymphs Abs: 1.9 10*3/uL (ref 0.7–4.0)
MCH: 27.5 pg (ref 26.0–34.0)
MCHC: 33 g/dL (ref 30.0–36.0)
MCV: 83.4 fL (ref 80.0–100.0)
Monocytes Absolute: 0.3 10*3/uL (ref 0.1–1.0)
Monocytes Relative: 7 %
Neutro Abs: 1.7 10*3/uL (ref 1.7–7.7)
Neutrophils Relative %: 44 %
Platelet Count: 223 10*3/uL (ref 150–400)
RBC: 4.65 MIL/uL (ref 3.87–5.11)
RDW: 14.9 % (ref 11.5–15.5)
WBC Count: 4 10*3/uL (ref 4.0–10.5)
nRBC: 0 % (ref 0.0–0.2)

## 2021-08-11 LAB — CMP (CANCER CENTER ONLY)
ALT: 13 U/L (ref 0–44)
AST: 20 U/L (ref 15–41)
Albumin: 4.2 g/dL (ref 3.5–5.0)
Alkaline Phosphatase: 87 U/L (ref 38–126)
Anion gap: 9 (ref 5–15)
BUN: 12 mg/dL (ref 8–23)
CO2: 27 mmol/L (ref 22–32)
Calcium: 9.3 mg/dL (ref 8.9–10.3)
Chloride: 106 mmol/L (ref 98–111)
Creatinine: 0.86 mg/dL (ref 0.44–1.00)
GFR, Estimated: >60 mL/min (ref >60)
Glucose, Bld: 93 mg/dL (ref 70–99)
Potassium: 4.1 mmol/L (ref 3.5–5.1)
Sodium: 142 mmol/L (ref 135–145)
Total Bilirubin: 0.5 mg/dL (ref 0.3–1.2)
Total Protein: 7.6 g/dL (ref 6.5–8.1)

## 2021-08-11 NOTE — Progress Notes (Signed)
error 

## 2021-08-11 NOTE — Patient Instructions (Signed)

## 2021-08-11 NOTE — Progress Notes (Signed)
[place] Psychosocial Distress Screening Spiritual Care  Met with Christine Lozano in [facility] to introduce [facility] team/resources, reviewing distress screen per protocol.  The patient scored a 1 on the Psychosocial Distress Thermometer which indicates mild distress. Also assessed for distress and other psychosocial needs.   ONCBCN DISTRESS SCREENING 08/11/2021  Screening Type Initial Screening  Distress experienced in past week (1-10) 1  Practical problem type Insurance  Emotional problem type Adjusting to illness  Spiritual/Religous concerns type Facing my mortality  Information Concerns Type Lack of info about diagnosis;Lack of info about treatment  Referral to palliative care Yes   Christine Lozano reports little distress. One of her top priorities is keeping her diagnosis and treatment private, rather than sharing with people in her life.   Follow up needed: No. Christine Lozano prefers to reach out as needed/desired.   [place], [place], [facility] Pager [phone] Voicemail [phone]

## 2021-08-11 NOTE — Assessment & Plan Note (Signed)
2001: Lumpectomy and radiation right breast 08/04/2021: Palpable thickening in the right breast retroareolar, ultrasound showed mass at 4:00: 1 cm, axilla negative.  MRI showed 2.1 cm retroareolar mass plus additional 1.2 cm mass (not biopsied), biopsy revealed grade 2 IDC prognostic panel pending  Pathology and radiology counseling:Discussed with the patient, the details of pathology including the type of breast cancer,the clinical staging, the significance of ER, PR and HER-2/neu receptors and the implications for treatment. After reviewing the pathology in detail, we proceeded to discuss the different treatment options between surgery, radiation, chemotherapy, antiestrogen therapies.  Recommendations: 1.  Right mastectomy 2.  Adjuvant antiestrogen therapy if ER positive    Return to clinic after surgery to discuss final pathology report and then determine if appropriate adjuvant therapy plan

## 2021-08-11 NOTE — Therapy (Signed)
[place], [place], [address] Phone: [phone]   Fax:  [phone]  Physical Therapy Evaluation  Patient Details  Name: Christine Lozano MRN: [id number] Date of Birth: [birth year] Referring Provider (PT): Dr. Autumn Messing   Encounter Date: 08/11/2021   PT End of Session - 08/11/21 1843     Visit Number 1    Number of Visits 2    Date for PT Re-Evaluation 10/06/21    PT Start Time 1304    PT Stop Time 1335   Also saw pt from 1443-1455 for a total of 43 min   PT Time Calculation (min) 31 min    Activity Tolerance Patient tolerated treatment well    Behavior During Therapy [facility] for tasks assessed/performed             Past Medical History:  Diagnosis Date   Facial twitching    Hypertension    Personal history of radiation therapy     Past Surgical History:  Procedure Laterality Date   BREAST BIOPSY Right 08/04/2021   BREAST CYST EXCISION Left [id number]   x2   BREAST LUMPECTOMY     BUNIONECTOMY Left 10 years ago   TOOTH EXTRACTION      There were no vitals filed for this visit.    Subjective Assessment - 08/11/21 1837     Subjective Patient reports she is here today to be seen by her medical team for her newly diagnosed right breast cancer.    Pertinent History Patient was diagnosed on 07/29/2021 with right grade II invasive ductal carcinoma breast cancer. It measures 2.1 cm and is located in the lower outer quadrant. It is ER/PR positive and her HER2 results are pending with a Ki67 of 10%. She has a history of right breast cancer in 2001 with a lumpectomy, sentinel node biopsy, and radiation.    Patient Stated Goals Reduce lymphedema risk and learn post op ROM HEP    Currently in Pain? No/denies                [facility] PT Assessment - 08/11/21 0001       Assessment   Medical Diagnosis Right breast cancer    Referring Provider (PT) Dr. Autumn Messing    Onset Date/Surgical Date 07/29/21     Hand Dominance Left    Prior Therapy none      Precautions   Precautions Other (comment)    Precaution Comments active cancer      Restrictions   Weight Bearing Restrictions No      Balance Screen   Has the patient fallen in the past 6 months No    Has the patient had a decrease in activity level because of a fear of falling?  No    Is the patient reluctant to leave their home because of a fear of falling?  No      Home Environment   Living Environment Private residence    Living Arrangements Spouse/significant other    Available Help at Discharge Family   But she reports she is not sharing this news with any family members     Prior Function   Level of [place] Retired    Leisure She does the treadmill or bike 3x/week for 20 min      Cognition   Overall Cognitive Status Within Functional Limits for tasks assessed      Posture/Postural Control   Posture/Postural Control Postural limitations  Postural Limitations Rounded Shoulders;Forward head      ROM / Strength   AROM / PROM / Strength AROM;Strength      AROM   Overall AROM Comments Cervical AROM is WNL    AROM Assessment Site Shoulder    Right/Left Shoulder Right;Left    Right Shoulder Extension 38 Degrees    Right Shoulder Flexion 145 Degrees    Right Shoulder ABduction 155 Degrees    Right Shoulder Internal Rotation 67 Degrees    Right Shoulder External Rotation 87 Degrees    Left Shoulder Extension 55 Degrees    Left Shoulder Flexion 143 Degrees    Left Shoulder ABduction 158 Degrees    Left Shoulder Internal Rotation 52 Degrees    Left Shoulder External Rotation 82 Degrees      Strength   Overall Strength Within functional limits for tasks performed               LYMPHEDEMA/ONCOLOGY QUESTIONNAIRE - 08/11/21 0001       Type   Cancer Type Right breast cancer      Lymphedema Assessments   Lymphedema Assessments Upper extremities      Right Upper Extremity  Lymphedema   10 cm Proximal to Olecranon Process 25.5 cm    Olecranon Process 22.8 cm    10 cm Proximal to Ulnar Styloid Process 20 cm    Just Proximal to Ulnar Styloid Process 14.5 cm    Across Hand at [employer] 18.4 cm    At [place] of 2nd Digit 6.1 cm      Left Upper Extremity Lymphedema   10 cm Proximal to Olecranon Process 26 cm    Olecranon Process 22.8 cm    10 cm Proximal to Ulnar Styloid Process 19.4 cm    Just Proximal to Ulnar Styloid Process 14.5 cm    Across Hand at [employer] 18.5 cm    At [place] of 2nd Digit 5.9 cm             L-DEX FLOWSHEETS - 08/11/21 1800       L-DEX LYMPHEDEMA SCREENING   Measurement Type Unilateral    L-DEX MEASUREMENT EXTREMITY Upper Extremity    POSITION  Standing    DOMINANT SIDE Left    At Risk Side Right    BASELINE SCORE (UNILATERAL) 6             The patient was assessed using the L-Dex machine today to produce a lymphedema index baseline score. The patient will be reassessed on a regular basis (typically every 3 months) to obtain new L-Dex scores. If the score is > 6.5 points away from his/her baseline score indicating onset of subclinical lymphedema, it will be recommended to wear a compression garment for 4 weeks, 12 hours per day and then be reassessed. If the score continues to be > 6.5 points from baseline at reassessment, we will initiate lymphedema treatment. Assessing in this manner has a 95% rate of preventing clinically significant lymphedema.      [name] - 08/11/21 0001     Open a tight or new jar No difficulty    Do heavy household chores (wash walls, wash floors) No difficulty    Carry a shopping bag or briefcase No difficulty    Wash your back No difficulty    Use a knife to cut food No difficulty    Recreational activities in which you take some force or impact through your arm, shoulder, or hand (golf,  hammering, tennis) No difficulty    During the past week, to what extent has your arm,  shoulder or hand problem interfered with your normal social activities with family, friends, neighbors, or groups? Not at all    During the past week, to what extent has your arm, shoulder or hand problem limited your work or other regular daily activities Not at all    Arm, shoulder, or hand pain. None    Tingling (pins and needles) in your arm, shoulder, or hand None    Difficulty Sleeping No difficulty    DASH Score 0 %              Objective measurements completed on examination: See above findings.      Patient was instructed today in a home exercise program today for post op shoulder range of motion. These included active assist shoulder flexion in sitting, scapular retraction, wall walking with shoulder abduction, and hands behind head external rotation.  She was encouraged to do these twice a day, holding 3 seconds and repeating 5 times when permitted by her physician.            PT Education - 08/11/21 1842     Education Details Lymphedema education and post op HEP    Person(s) Educated Patient    Methods Explanation;Demonstration;Handout    Comprehension Returned demonstration;Verbalized understanding                 PT Long Term Goals - 08/11/21 1846       PT LONG TERM GOAL #1   Title Patient will demonstrate she has regained full shoulder ROM and function post op compared to baselines.    Time 8    Period Weeks    Status New    Target Date 10/06/21             Breast Clinic Goals - 08/11/21 1846       Patient will be able to verbalize understanding of pertinent lymphedema risk reduction practices relevant to her diagnosis specifically related to skin care.   Time 1    Period Days    Status Achieved      Patient will be able to return demonstrate and/or verbalize understanding of the post-op home exercise program related to regaining shoulder range of motion.   Time 1    Period Days    Status Achieved      Patient will be able to  verbalize understanding of the importance of attending the postoperative After Breast Cancer Class for further lymphedema risk reduction education and therapeutic exercise.   Time 1    Period Days    Status Achieved                   Plan - 08/11/21 1844     Clinical Impression Statement Patient was diagnosed on 07/29/2021 with right grade II invasive ductal carcinoma breast cancer. It measures 2.1 cm and is located in the lower outer quadrant. It is ER/PR positive and her HER2 results are pending with a Ki67 of 10%. She has a history of right breast cancer in 2001 with a lumpectomy, sentinel node biopsy, and radiation. Her multidisciplinary medical team met prior to her assessments to determine a recommended treatment plan. She is planinng to have a right mastectomy and sentinel node biopsy followed by anti-estrogen therapy. She will benefit from a post op PT reassessment to determine needs and from L-Dex screens every 3 months for 2 years to detect  subclinical lymphedema.    Stability/Clinical Decision Making Stable/Uncomplicated    Clinical Decision Making Low    Rehab Potential Excellent    PT Frequency --   Eval and 1 f/u visit   PT Treatment/Interventions ADLs/Self Care Home Management;Therapeutic exercise;Patient/family education    PT Next Visit Plan Will reassess 3-4 weeks post op    PT Home Exercise Plan Post op HEP    Consulted and Agree with Plan of Care Patient             Patient will benefit from skilled therapeutic intervention in order to improve the following deficits and impairments:  Postural dysfunction, Decreased range of motion, Decreased knowledge of precautions, Impaired UE functional use, Pain  Visit Diagnosis: Malignant neoplasm of lower-inner quadrant of right breast of female, estrogen receptor positive ([place]) - Plan: PT plan of care cert/re-cert  Abnormal posture - Plan: PT plan of care cert/re-cert   Patient will follow up at outpatient cancer  rehab 3-4 weeks following surgery.  If the patient requires physical therapy at that time, a specific plan will be dictated and sent to the referring physician for approval. The patient was educated today on appropriate basic range of motion exercises to begin post operatively and the importance of attending the After Breast Cancer class following surgery.  Patient was educated today on lymphedema risk reduction practices as it pertains to recommendations that will benefit the patient immediately following surgery.  She verbalized good understanding.     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

## 2021-08-13 ENCOUNTER — Encounter: Payer: Self-pay | Admitting: Plastic Surgery

## 2021-08-13 ENCOUNTER — Ambulatory Visit (INDEPENDENT_AMBULATORY_CARE_PROVIDER_SITE_OTHER): Payer: Medicare Other | Admitting: Plastic Surgery

## 2021-08-13 ENCOUNTER — Other Ambulatory Visit: Payer: Self-pay

## 2021-08-13 VITALS — BP 164/82 | HR 63 | Ht 64.0 in | Wt 137.4 lb

## 2021-08-13 DIAGNOSIS — C50311 Malignant neoplasm of lower-inner quadrant of right female breast: Secondary | ICD-10-CM | POA: Diagnosis not present

## 2021-08-13 DIAGNOSIS — Z170 Estrogen receptor positive status [ER+]: Secondary | ICD-10-CM

## 2021-08-17 ENCOUNTER — Encounter: Payer: Self-pay | Admitting: *Deleted

## 2021-08-17 ENCOUNTER — Telehealth: Payer: Self-pay | Admitting: *Deleted

## 2021-08-17 NOTE — Telephone Encounter (Signed)
Spoke with patient to follow up from [facility] 9/21 and assess navigation needs.  Patient is asking if her surgery can be after 11/6 due to a church play that she is participating in. She tells me she is not going to do reconstruction.  Informed her I would discuss with Dr. Lindi Adie and that possibly she could start on AI until sx is performed.  Patient verbalized understanding. I informed her I would call her back after talking with Dr. Lindi Adie.

## 2021-08-18 ENCOUNTER — Telehealth: Payer: Self-pay | Admitting: *Deleted

## 2021-08-18 MED ORDER — ANASTROZOLE 1 MG PO TABS: 1.0000 mg | ORAL_TABLET | Freq: Every day | ORAL | 0 refills | Status: DC

## 2021-08-18 NOTE — Telephone Encounter (Signed)
Received order for oncotype testing on CORE bx. Requisition faxed to [facility] and [facility]

## 2021-08-18 NOTE — Telephone Encounter (Signed)
Ordered oncotype (core) per Dr. Lindi Adie. Faxed requisition to [facility].

## 2021-08-18 NOTE — Telephone Encounter (Signed)
Left voicemail that I will call in anastrozole for her to start taking since she wants to have sx after 11/6. I will notify Dr. Ethlyn Gallery office as well.

## 2021-08-19 ENCOUNTER — Encounter: Payer: Self-pay | Admitting: Plastic Surgery

## 2021-08-19 NOTE — Progress Notes (Signed)
Patient ID: Christine Lozano, female    DOB: 08-Oct-1956, 65 y.o.   MRN: [id number]   Chief Complaint  Patient presents with   Advice Only   Breast Cancer    The patient is a 65 yrs old female here with her daughter for consultation for breast reconstruction.  She had right breast cancer in the past and underwent a right partial mastectomy with postoperative radiation.  She was diagnosed with invasive ductal carcinoma of the right breast with thickening and pain in the retro-areola area 9/22.  She is 5 feet 4 inches tall and weighs 137 pounds. Her preoperative bra size is a 34 B but she has asymmetry due to the partial mastectomy on the right. She is planning on having a right mastectomy.  Her surgeon is Dr. Marlou Starks.    Review of Systems  Constitutional: Negative.   Eyes: Negative.   Respiratory: Negative.  Negative for chest tightness and shortness of breath.   Cardiovascular:  Negative for leg swelling.  Gastrointestinal: Negative.   Endocrine: Negative.   Genitourinary: Negative.   Skin: Negative.  Negative for color change and wound.  Hematological: Negative.   Psychiatric/Behavioral: Negative.     Past Medical History:  Diagnosis Date   Facial twitching    Hypertension    Personal history of radiation therapy     Past Surgical History:  Procedure Laterality Date   BREAST BIOPSY Right 08/04/2021   BREAST CYST EXCISION Left 8546,[id number]   x2   BREAST LUMPECTOMY     BUNIONECTOMY Left 10 years ago   TOOTH EXTRACTION        Current Outpatient Medications:    amLODipine (NORVASC) 5 MG tablet, Take 1 tablet by mouth daily., Disp: , Rfl:    Calcium Carbonate (CALCIUM 500 PO), Take by mouth., Disp: , Rfl:    Cholecalciferol (VITAMIN D3) 10 MCG (400 UNIT) tablet, Take by mouth., Disp: , Rfl:    [employer] VITAMIN B12 1000 MCG TBCR, Take 1 tablet by mouth daily., Disp: , Rfl:    incobotulinumtoxinA (XEOMIN) 50 units SOLR injection, Inject 50 Units into the muscle every 3 (three) months.,  Disp: 1 each, Rfl: 3   trihexyphenidyl (ARTANE) 2 MG tablet, Take 1 tablet (2 mg total) by mouth 3 (three) times daily with meals., Disp: 90 tablet, Rfl: 3   anastrozole (ARIMIDEX) 1 MG tablet, Take 1 tablet (1 mg total) by mouth daily., Disp: 90 tablet, Rfl: 0  Current Facility-Administered Medications:    incobotulinumtoxinA (XEOMIN) 50 units injection 50 Units, 50 Units, Intramuscular, Q90 days, Marcial Pacas, MD, 50 Units at 03/16/21 0945   Objective:   Vitals:   08/13/21 0853  BP: (!) 164/82  Pulse: 63  SpO2: 100%    Physical Exam Vitals and nursing note reviewed.  Constitutional:      Appearance: Normal appearance.  HENT:     Head: Normocephalic and atraumatic.  Cardiovascular:     Rate and Rhythm: Normal rate.     Pulses: Normal pulses.  Pulmonary:     Effort: Pulmonary effort is normal. No respiratory distress.  Abdominal:     General: Abdomen is flat. There is no distension.     Tenderness: There is no abdominal tenderness.  Musculoskeletal:        General: Deformity present. No swelling, tenderness or signs of injury.  Skin:    General: Skin is warm.     Capillary Refill: Capillary refill takes less than 2 seconds.  Coloration: Skin is not jaundiced or pale.     Findings: No lesion.  Neurological:     General: No focal deficit present.     Mental Status: She is alert and oriented to person, place, and time.  Psychiatric:        Mood and Affect: Mood normal.        Behavior: Behavior normal.        Thought Content: Thought content normal.    Assessment & Plan:  Malignant neoplasm of lower-inner quadrant of right breast of female, estrogen receptor positive ([place]) The options for reconstruction we explained to the patient / family for breast reconstruction.  There are two general categories of reconstruction.  We can reconstruction a breast with implants or use the patient's own tissue.  These were further discussed as listed.  Breast reconstruction is an  optional procedure and eligibility depends on the full spectrum of the health of the patient and any co-morbidities.  More than one surgery is often needed to complete the reconstruction process.  The process can take three to twelve months to complete.  The breasts will not be identical due to many factors such as rib differences, shoulder asymmetry and treatments such as radiation.  The goal is to get the breasts to look normal and symmetrical in clothes.  Scars are a part of surgery and may fade some in time but will always be present under clothes.  Surgery may be an option on the non-cancer breast to achieve more symmetry.  No matter which procedure is chosen there is always the risk of complications and even failure of the body to heal.  This could result in no breast.    The options for reconstruction include:  1. Placement of a tissue expander with Acellular dermal matrix. When the expander is the desired size surgery is performed to remove the expander and place an implant.  In some cases the implant can be placed without an expander.  2. Autologous reconstruction can include using a muscle or tissue from another area of the body to create a breast.  3. Combined procedures (ie. latissismus dorsi flap) can be done with an expander / implant placed under the muscle.   The risks, benefits, scars and recovery time were discussed for each of the above. Risks include bleeding, infection, hematoma, seroma, scarring, pain, wound healing complications, flap loss, fat necrosis, capsular contracture, need for implant removal, donor site complications, bulge, hernia, umbilical necrosis, need for urgent reoperation, and need for dressing changes.   The procedure the patient selected / that was best for the patient, was then discussed in further detail.  Total time: 45 minutes. This includes time spent with the patient during the visit as well as time spent before and after the visit reviewing the chart,  documenting the encounter, making phone calls and reviewing studies.   After a lengthy discussion the patient has decided not to do any reconstruction to the right breast.  She understands that due to the right breast radiation her size would be limited.  She does not want a muscle flap.  She may want a reduction mastopexy on the left side after things have all healed.  She knows to come back and see [place] if she is interested.  Pictures were obtained of the patient and placed in the chart with the patient's or guardian's permission.   [place], DO

## 2021-08-23 ENCOUNTER — Telehealth: Payer: Self-pay | Admitting: Hematology and Oncology

## 2021-08-23 NOTE — Telephone Encounter (Signed)
Scheduled per 10/3 sch msg, msg was left with pt

## 2021-08-27 ENCOUNTER — Telehealth: Payer: Self-pay | Admitting: *Deleted

## 2021-08-27 ENCOUNTER — Encounter: Payer: Self-pay | Admitting: *Deleted

## 2021-08-27 NOTE — Telephone Encounter (Signed)
Received oncotype score of 17 on core bx. Physician team notified. Called pt, left vm with results and no recommendations for chemo. Contact information provided for questions or needs.

## 2021-09-03 ENCOUNTER — Encounter (HOSPITAL_COMMUNITY): Payer: Self-pay

## 2021-09-10 ENCOUNTER — Encounter: Payer: Self-pay | Admitting: Hematology and Oncology

## 2021-09-16 ENCOUNTER — Telehealth: Payer: Self-pay | Admitting: *Deleted

## 2021-09-16 NOTE — Telephone Encounter (Signed)
Spoke to pt concerning healthy maintenance dental and colonoscopy. Discussed mobility post op as well as diet and walking program. Denies further questions or needs.

## 2021-09-22 NOTE — Pre-Procedure Instructions (Signed)
Surgical Instructions    Your procedure is scheduled on Monday 09/27/21.   Report to [facility] Main Entrance "A" at 06:30 A.M., then check in with the Admitting office.  Call this number if you have problems the morning of surgery:  [phone]   If you have any questions prior to your surgery date call [phone]: Open Monday-Friday 8am-4pm    Remember:  Do not eat after midnight the night before your surgery  You may drink clear liquids until 05:30 A.M. the morning of your surgery.   Clear liquids allowed are: Water, Non-Citrus Juices (without pulp), Carbonated Beverages, Clear Tea, Black Coffee ONLY (NO MILK, CREAM OR POWDERED CREAMER of any kind), and Gatorade    Take these medicines the morning of surgery with A SIP OF WATER   amLODipine (NORVASC)  anastrozole (ARIMIDEX)   As of today, STOP taking any Aspirin (unless otherwise instructed by your surgeon) Aleve, Naproxen, Ibuprofen, Motrin, Advil, Goody's, BC's, all herbal medications, fish oil, and all vitamins.     After your COVID test   You are not required to quarantine however you are required to wear a well-fitting mask when you are out and around people not in your household.  If your mask becomes wet or soiled, replace with a new one.  Wash your hands often with soap and water for 20 seconds or clean your hands with an alcohol-based hand sanitizer that contains at least 60% alcohol.  Do not share personal items.  Notify your provider: if you are in close contact with someone who has COVID  or if you develop a fever of 100.4 or greater, sneezing, cough, sore throat, shortness of breath or body aches.             Do not wear jewelry or makeup Do not wear lotions, powders, perfumes/colognes, or deodorant. Do not shave 48 hours prior to surgery.  Men may shave face and neck. Do not bring valuables to the hospital. DO Not wear nail polish, gel polish, artificial nails, or any other type of covering on natural  nails including finger and toenails. If patients have artificial nails, gel coating, etc. that need to be removed by a nail salon, please have this removed prior to surgery or surgery may need to be canceled/delayed if the surgeon/ anesthesia feels like the patient is unable to be adequately monitored.             [facility] is not responsible for any belongings or valuables.  Do NOT Smoke (Tobacco/Vaping)  24 hours prior to your procedure  If you use a CPAP at night, you may bring your mask for your overnight stay.   Contacts, glasses, hearing aids, dentures or partials may not be worn into surgery, please bring cases for these belongings   For patients admitted to the hospital, discharge time will be determined by your treatment team.   Patients discharged the day of surgery will not be allowed to drive home, and someone needs to stay with them for 24 hours.  NO VISITORS WILL BE ALLOWED IN PRE-OP WHERE PATIENTS ARE PREPPED FOR SURGERY.  ONLY 1 SUPPORT PERSON MAY BE PRESENT IN THE WAITING ROOM WHILE YOU ARE IN SURGERY.  IF YOU ARE TO BE ADMITTED, ONCE YOU ARE IN YOUR ROOM YOU WILL BE ALLOWED TWO (2) VISITORS. 1 (ONE) VISITOR MAY STAY OVERNIGHT BUT MUST ARRIVE TO THE ROOM BY 8pm.  Minor children may have two parents present. Special consideration for safety and communication needs  will be reviewed on a case by case basis.  Special instructions:    Oral Hygiene is also important to reduce your risk of infection.  Remember - BRUSH YOUR TEETH THE MORNING OF SURGERY WITH YOUR REGULAR TOOTHPASTE   [place] [facility]- Preparing For Surgery  Before surgery, you can play an important role. Because skin is not sterile, your skin needs to be as free of germs as possible. You can reduce the number of germs on your skin by washing with CHG (chlorahexidine gluconate) Soap before surgery.  CHG is an antiseptic cleaner which kills germs and bonds with the skin to continue killing germs even after washing.      Please do not use if you have an allergy to CHG or antibacterial soaps. If your skin becomes reddened/irritated stop using the CHG.  Do not shave (including legs and underarms) for at least 48 hours prior to first CHG shower. It is OK to shave your face.  Please follow these instructions carefully.     Shower the NIGHT BEFORE SURGERY and the MORNING OF SURGERY with CHG Soap.   If you chose to wash your hair, wash your hair first as usual with your normal shampoo. After you shampoo, rinse your hair and body thoroughly to remove the shampoo.  Then [employer] and genitals (private parts) with your normal soap and rinse thoroughly to remove soap.  After that Use CHG Soap as you would any other liquid soap. You can apply CHG directly to the skin and wash gently with a scrungie or a clean washcloth.   Apply the CHG Soap to your body ONLY FROM THE NECK DOWN.  Do not use on open wounds or open sores. Avoid contact with your eyes, ears, mouth and genitals (private parts). Wash Face and genitals (private parts)  with your normal soap.   Wash thoroughly, paying special attention to the area where your surgery will be performed.  Thoroughly rinse your body with warm water from the neck down.  DO NOT shower/wash with your normal soap after using and rinsing off the CHG Soap.  Pat yourself dry with a CLEAN TOWEL.  Wear CLEAN PAJAMAS to bed the night before surgery  Place CLEAN SHEETS on your bed the night before your surgery  DO NOT SLEEP WITH PETS.   Day of Surgery:  Take a shower with CHG soap. Wear Clean/Comfortable clothing the morning of surgery Do not apply any deodorants/lotions.   Remember to brush your teeth WITH YOUR REGULAR TOOTHPASTE.   Please read over the following fact sheets that you were given.

## 2021-09-23 ENCOUNTER — Encounter (HOSPITAL_COMMUNITY): Payer: Self-pay

## 2021-09-23 ENCOUNTER — Other Ambulatory Visit: Payer: Self-pay

## 2021-09-23 ENCOUNTER — Encounter (HOSPITAL_COMMUNITY)
Admission: RE | Admit: 2021-09-23 | Discharge: 2021-09-23 | Disposition: A | Discharge status: Home / Self Care | Payer: Medicare Other | Source: Ambulatory Visit | Attending: General Surgery | Admitting: General Surgery

## 2021-09-23 VITALS — BP 126/79 | HR 65 | Temp 98.6°F | Resp 18 | Ht 64.0 in | Wt 138.0 lb

## 2021-09-23 DIAGNOSIS — Z01818 Encounter for other preprocedural examination: Secondary | ICD-10-CM | POA: Insufficient documentation

## 2021-09-23 DIAGNOSIS — Z20822 Contact with and (suspected) exposure to covid-19: Secondary | ICD-10-CM | POA: Insufficient documentation

## 2021-09-23 LAB — BASIC METABOLIC PANEL
Anion gap: 4 — ABNORMAL LOW (ref 5–15)
BUN: 6 mg/dL — ABNORMAL LOW (ref 8–23)
CO2: 29 mmol/L (ref 22–32)
Calcium: 9 mg/dL (ref 8.9–10.3)
Chloride: 107 mmol/L (ref 98–111)
Creatinine, Ser: 0.71 mg/dL (ref 0.44–1.00)
GFR, Estimated: >60 mL/min (ref >60)
Glucose, Bld: 120 mg/dL — ABNORMAL HIGH (ref 70–99)
Potassium: 3.9 mmol/L (ref 3.5–5.1)
Sodium: 140 mmol/L (ref 135–145)

## 2021-09-23 LAB — CBC
HCT: 38.5 % (ref 36.0–46.0)
Hemoglobin: 12.3 g/dL (ref 12.0–15.0)
MCH: 27.6 pg (ref 26.0–34.0)
MCHC: 31.9 g/dL (ref 30.0–36.0)
MCV: 86.3 fL (ref 80.0–100.0)
Platelets: 197 10*3/uL (ref 150–400)
RBC: 4.46 MIL/uL (ref 3.87–5.11)
RDW: 14.4 % (ref 11.5–15.5)
WBC: 5 10*3/uL (ref 4.0–10.5)
nRBC: 0 % (ref 0.0–0.2)

## 2021-09-23 NOTE — Progress Notes (Addendum)
PCP - Rachell Cipro Cardiologist - denies OB_GYN: Vernice Jefferson  PPM/ICD - denies   Chest x-ray - n/a EKG - 09/23/21 Stress Test - denies ECHO - denies Cardiac Cath - denies  Sleep Study - denies   No diabetes  As of today, STOP taking any Aspirin (unless otherwise instructed by your surgeon) Aleve, Naproxen, Ibuprofen, Motrin, Advil, Goody's, BC's, all herbal medications, fish oil, and all vitamins.  ERAS Protcol -yes PRE-SURGERY Ensure or G2- not ordered  COVID TEST- done in PAT 09/23/21   Anesthesia review: abnormal EKG  Patient denies shortness of breath, fever, cough and chest pain at PAT appointment   All instructions explained to the patient, with a verbal understanding of the material. Patient agrees to go over the instructions while at home for a better understanding. Patient also instructed to self quarantine after being tested for COVID-19. The opportunity to ask questions was provided.

## 2021-09-24 LAB — SARS CORONAVIRUS 2 (TAT 6-24 HRS): SARS Coronavirus 2: NEGATIVE

## 2021-09-26 NOTE — Anesthesia Preprocedure Evaluation (Addendum)
Anesthesia Evaluation  Patient identified by MRN, date of birth, ID band Patient awake    Reviewed: Allergy & Precautions, NPO status , Patient's Chart, lab work & pertinent test results  History of Anesthesia Complications Negative for: history of anesthetic complications  Airway Mallampati: II  TM Distance: >3 FB Neck ROM: Full    Dental  (+) Teeth Intact, Dental Advisory Given   Pulmonary neg pulmonary ROS,    Pulmonary exam normal breath sounds clear to auscultation       Cardiovascular hypertension, Pt. on medications Normal cardiovascular exam Rhythm:Regular Rate:Normal     Neuro/Psych negative neurological ROS     GI/Hepatic negative GI ROS, Neg liver ROS,   Endo/Other  negative endocrine ROS  Renal/GU negative Renal ROS     Musculoskeletal negative musculoskeletal ROS (+)   Abdominal   Peds  Hematology negative hematology ROS (+)   Anesthesia Other Findings  RIGHT BREAST CANCER  Reproductive/Obstetrics                           Anesthesia Physical Anesthesia Plan  ASA: 2  Anesthesia Plan: General   Post-op Pain Management:  Regional for Post-op pain   Induction:   PONV Risk Score and Plan: 3 and Midazolam, Dexamethasone and Ondansetron  Airway Management Planned: LMA  Additional Equipment:   Intra-op Plan:   Post-operative Plan: Extubation in OR  Informed Consent: I have reviewed the patients History and Physical, chart, labs and discussed the procedure including the risks, benefits and alternatives for the proposed anesthesia with the patient or authorized representative who has indicated his/her understanding and acceptance.     Dental advisory given  Plan Discussed with: CRNA  Anesthesia Plan Comments:        Anesthesia Quick Evaluation

## 2021-09-27 ENCOUNTER — Encounter (HOSPITAL_COMMUNITY): Payer: Self-pay | Admitting: General Surgery

## 2021-09-27 ENCOUNTER — Ambulatory Visit (HOSPITAL_COMMUNITY): Payer: Medicare Other | Admitting: Physician Assistant

## 2021-09-27 ENCOUNTER — Ambulatory Visit (HOSPITAL_COMMUNITY): Payer: Medicare Other | Admitting: Anesthesiology

## 2021-09-27 ENCOUNTER — Ambulatory Visit (HOSPITAL_COMMUNITY)
Admission: RE | Admit: 2021-09-27 | Discharge: 2021-09-27 | Disposition: A | Discharge status: Home / Self Care | Payer: Medicare Other | Source: Ambulatory Visit | Attending: General Surgery | Admitting: General Surgery

## 2021-09-27 ENCOUNTER — Ambulatory Visit (HOSPITAL_COMMUNITY)
Admission: RE | Admit: 2021-09-27 | Discharge: 2021-09-28 | Disposition: A | Discharge status: Home / Self Care | Payer: Medicare Other | Attending: General Surgery | Admitting: General Surgery

## 2021-09-27 ENCOUNTER — Other Ambulatory Visit: Payer: Self-pay

## 2021-09-27 ENCOUNTER — Encounter (HOSPITAL_COMMUNITY)
Admission: RE | Disposition: A | Discharge status: Home / Self Care | Payer: Self-pay | Source: Home / Self Care | Attending: General Surgery

## 2021-09-27 DIAGNOSIS — I10 Essential (primary) hypertension: Secondary | ICD-10-CM | POA: Diagnosis not present

## 2021-09-27 DIAGNOSIS — Z170 Estrogen receptor positive status [ER+]: Secondary | ICD-10-CM

## 2021-09-27 DIAGNOSIS — C50311 Malignant neoplasm of lower-inner quadrant of right female breast: Secondary | ICD-10-CM

## 2021-09-27 DIAGNOSIS — Z923 Personal history of irradiation: Secondary | ICD-10-CM | POA: Insufficient documentation

## 2021-09-27 DIAGNOSIS — C50111 Malignant neoplasm of central portion of right female breast: Secondary | ICD-10-CM | POA: Diagnosis present

## 2021-09-27 DIAGNOSIS — Z79899 Other long term (current) drug therapy: Secondary | ICD-10-CM | POA: Diagnosis not present

## 2021-09-27 DIAGNOSIS — C50911 Malignant neoplasm of unspecified site of right female breast: Secondary | ICD-10-CM | POA: Diagnosis present

## 2021-09-27 HISTORY — PX: MASTECTOMY W/ SENTINEL NODE BIOPSY: SHX2001

## 2021-09-27 SURGERY — MASTECTOMY WITH SENTINEL LYMPH NODE BIOPSY
Anesthesia: General | Site: Breast | Laterality: Right

## 2021-09-27 MED ORDER — EPHEDRINE SULFATE-NACL 50-0.9 MG/10ML-% IV SOSY
PREFILLED_SYRINGE | INTRAVENOUS | Status: DC | PRN
Start: 2021-09-27 — End: 2021-09-27
  Administered 2021-09-27: 10 mg via INTRAVENOUS

## 2021-09-27 MED ORDER — MIDAZOLAM HCL 2 MG/2ML IJ SOLN
INTRAMUSCULAR | Status: AC
  Administered 2021-09-27: 2 mg via INTRAVENOUS
  Filled 2021-09-27: qty 2

## 2021-09-27 MED ORDER — CELECOXIB 200 MG PO CAPS
ORAL_CAPSULE | ORAL | Status: AC
  Administered 2021-09-27: 200 mg via ORAL
  Filled 2021-09-27: qty 1

## 2021-09-27 MED ORDER — ACETAMINOPHEN 500 MG PO TABS: 1000.0000 mg | ORAL_TABLET | ORAL | Status: AC

## 2021-09-27 MED ORDER — ONDANSETRON HCL 4 MG/2ML IJ SOLN
4.0000 mg | Freq: Four times a day (QID) | INTRAMUSCULAR | Status: DC | PRN
  Administered 2021-09-27: 4 mg via INTRAVENOUS
  Filled 2021-09-27: qty 2

## 2021-09-27 MED ORDER — SODIUM CHLORIDE (PF) 0.9 % IJ SOLN
INTRAMUSCULAR | Status: AC
  Filled 2021-09-27: qty 10

## 2021-09-27 MED ORDER — MORPHINE SULFATE (PF) 2 MG/ML IV SOLN: 1.0000 mg | INTRAVENOUS | Status: DC | PRN

## 2021-09-27 MED ORDER — METHYLENE BLUE 0.5 % INJ SOLN
INTRAVENOUS | Status: AC
  Filled 2021-09-27: qty 10

## 2021-09-27 MED ORDER — CHLORHEXIDINE GLUCONATE 0.12 % MT SOLN
OROMUCOSAL | Status: AC
  Administered 2021-09-27: 15 mL via OROMUCOSAL
  Filled 2021-09-27: qty 15

## 2021-09-27 MED ORDER — LIDOCAINE 2% (20 MG/ML) 5 ML SYRINGE
INTRAMUSCULAR | Status: AC
  Filled 2021-09-27: qty 5

## 2021-09-27 MED ORDER — 0.9 % SODIUM CHLORIDE (POUR BTL) OPTIME
TOPICAL | Status: DC | PRN
  Administered 2021-09-27: 1000 mL

## 2021-09-27 MED ORDER — HYDROCODONE-ACETAMINOPHEN 5-325 MG PO TABS
1.0000 | ORAL_TABLET | ORAL | Status: DC | PRN
  Administered 2021-09-27: 2 via ORAL
  Administered 2021-09-28: 1 via ORAL
  Filled 2021-09-27 (×2): qty 1

## 2021-09-27 MED ORDER — METHOCARBAMOL 500 MG PO TABS: 500.0000 mg | ORAL_TABLET | Freq: Four times a day (QID) | ORAL | 3 refills | Status: DC | PRN

## 2021-09-27 MED ORDER — LIDOCAINE 2% (20 MG/ML) 5 ML SYRINGE
INTRAMUSCULAR | Status: DC | PRN
  Administered 2021-09-27: 60 mg via INTRAVENOUS

## 2021-09-27 MED ORDER — CEFAZOLIN SODIUM-DEXTROSE 2-4 GM/100ML-% IV SOLN
INTRAVENOUS | Status: AC
  Filled 2021-09-27: qty 100

## 2021-09-27 MED ORDER — PROPOFOL 10 MG/ML IV BOLUS
INTRAVENOUS | Status: DC | PRN
  Administered 2021-09-27: 120 mg via INTRAVENOUS

## 2021-09-27 MED ORDER — SODIUM CHLORIDE 0.9 % IV SOLN: INTRAVENOUS | Status: DC

## 2021-09-27 MED ORDER — CHLORHEXIDINE GLUCONATE CLOTH 2 % EX PADS: 6.0000 | MEDICATED_PAD | Freq: Once | CUTANEOUS | Status: DC

## 2021-09-27 MED ORDER — MIDAZOLAM HCL 2 MG/2ML IJ SOLN
INTRAMUSCULAR | Status: AC
  Filled 2021-09-27: qty 2

## 2021-09-27 MED ORDER — PHENYLEPHRINE 40 MCG/ML (10ML) SYRINGE FOR IV PUSH (FOR BLOOD PRESSURE SUPPORT)
PREFILLED_SYRINGE | INTRAVENOUS | Status: AC
  Filled 2021-09-27: qty 10

## 2021-09-27 MED ORDER — CLONIDINE HCL (ANALGESIA) 100 MCG/ML EP SOLN
EPIDURAL | Status: DC | PRN
  Administered 2021-09-27: 50 ug

## 2021-09-27 MED ORDER — AMLODIPINE BESYLATE 5 MG PO TABS: 5.0000 mg | ORAL_TABLET | Freq: Every day | ORAL | Status: DC

## 2021-09-27 MED ORDER — CEFAZOLIN SODIUM-DEXTROSE 2-4 GM/100ML-% IV SOLN
2.0000 g | INTRAVENOUS | Status: AC
  Administered 2021-09-27: 2 g via INTRAVENOUS

## 2021-09-27 MED ORDER — METHOCARBAMOL 500 MG PO TABS: 500.0000 mg | ORAL_TABLET | Freq: Four times a day (QID) | ORAL | Status: DC | PRN

## 2021-09-27 MED ORDER — FENTANYL CITRATE (PF) 100 MCG/2ML IJ SOLN
INTRAMUSCULAR | Status: AC
  Filled 2021-09-27: qty 2

## 2021-09-27 MED ORDER — DEXAMETHASONE SODIUM PHOSPHATE 10 MG/ML IJ SOLN
INTRAMUSCULAR | Status: AC
  Filled 2021-09-27: qty 1

## 2021-09-27 MED ORDER — GABAPENTIN 300 MG PO CAPS
ORAL_CAPSULE | ORAL | Status: AC
  Administered 2021-09-27: 300 mg via ORAL
  Filled 2021-09-27: qty 1

## 2021-09-27 MED ORDER — LACTATED RINGERS IV SOLN: INTRAVENOUS | Status: DC

## 2021-09-27 MED ORDER — ACETAMINOPHEN 500 MG PO TABS
ORAL_TABLET | ORAL | Status: AC
  Administered 2021-09-27: 1000 mg via ORAL
  Filled 2021-09-27: qty 2

## 2021-09-27 MED ORDER — FENTANYL CITRATE (PF) 100 MCG/2ML IJ SOLN
100.0000 ug | Freq: Once | INTRAMUSCULAR | Status: AC
  Filled 2021-09-27: qty 2

## 2021-09-27 MED ORDER — FENTANYL CITRATE (PF) 250 MCG/5ML IJ SOLN
INTRAMUSCULAR | Status: AC
  Filled 2021-09-27: qty 5

## 2021-09-27 MED ORDER — BUPIVACAINE-EPINEPHRINE (PF) 0.5% -1:200000 IJ SOLN
INTRAMUSCULAR | Status: DC | PRN
  Administered 2021-09-27: 450 mL via PERINEURAL
  Administered 2021-09-27: 30 mL via PERINEURAL

## 2021-09-27 MED ORDER — PANTOPRAZOLE SODIUM 40 MG IV SOLR
40.0000 mg | Freq: Every day | INTRAVENOUS | Status: DC
  Administered 2021-09-27: 40 mg via INTRAVENOUS
  Filled 2021-09-27: qty 40

## 2021-09-27 MED ORDER — TECHNETIUM TC 99M TILMANOCEPT KIT
1.0000 | PACK | Freq: Once | INTRAVENOUS | Status: AC | PRN
  Administered 2021-09-27: 1 via INTRADERMAL

## 2021-09-27 MED ORDER — PHENYLEPHRINE 40 MCG/ML (10ML) SYRINGE FOR IV PUSH (FOR BLOOD PRESSURE SUPPORT)
PREFILLED_SYRINGE | INTRAVENOUS | Status: DC | PRN
Start: 2021-09-27 — End: 2021-09-27
  Administered 2021-09-27: 80 ug via INTRAVENOUS
  Administered 2021-09-27: 120 ug via INTRAVENOUS

## 2021-09-27 MED ORDER — HYDROCODONE-ACETAMINOPHEN 5-325 MG PO TABS: 1.0000 | ORAL_TABLET | Freq: Four times a day (QID) | ORAL | 0 refills | Status: DC | PRN

## 2021-09-27 MED ORDER — FENTANYL CITRATE (PF) 100 MCG/2ML IJ SOLN
INTRAMUSCULAR | Status: AC
  Administered 2021-09-27: 100 ug via INTRAVENOUS
  Filled 2021-09-27: qty 2

## 2021-09-27 MED ORDER — FENTANYL CITRATE (PF) 250 MCG/5ML IJ SOLN
INTRAMUSCULAR | Status: DC | PRN
  Administered 2021-09-27: 50 ug via INTRAVENOUS

## 2021-09-27 MED ORDER — CELECOXIB 200 MG PO CAPS: 200.0000 mg | ORAL_CAPSULE | ORAL | Status: AC

## 2021-09-27 MED ORDER — ORAL CARE MOUTH RINSE: 15.0000 mL | Freq: Once | OROMUCOSAL | Status: AC

## 2021-09-27 MED ORDER — HEPARIN SODIUM (PORCINE) 5000 UNIT/ML IJ SOLN
5000.0000 [IU] | Freq: Three times a day (TID) | INTRAMUSCULAR | Status: DC
  Administered 2021-09-28: 5000 [IU] via SUBCUTANEOUS
  Filled 2021-09-27: qty 1

## 2021-09-27 MED ORDER — ONDANSETRON HCL 4 MG/2ML IJ SOLN
INTRAMUSCULAR | Status: AC
  Filled 2021-09-27: qty 2

## 2021-09-27 MED ORDER — FENTANYL CITRATE (PF) 100 MCG/2ML IJ SOLN
25.0000 ug | INTRAMUSCULAR | Status: DC | PRN
  Administered 2021-09-27 (×2): 25 ug via INTRAVENOUS

## 2021-09-27 MED ORDER — PROPOFOL 10 MG/ML IV BOLUS
INTRAVENOUS | Status: AC
  Filled 2021-09-27: qty 20

## 2021-09-27 MED ORDER — ONDANSETRON HCL 4 MG/2ML IJ SOLN: 4.0000 mg | Freq: Once | INTRAMUSCULAR | Status: DC | PRN

## 2021-09-27 MED ORDER — ONDANSETRON 4 MG PO TBDP: 4.0000 mg | ORAL_TABLET | Freq: Four times a day (QID) | ORAL | Status: DC | PRN

## 2021-09-27 MED ORDER — MIDAZOLAM HCL 2 MG/2ML IJ SOLN
2.0000 mg | Freq: Once | INTRAMUSCULAR | Status: AC
  Filled 2021-09-27: qty 2

## 2021-09-27 MED ORDER — CHLORHEXIDINE GLUCONATE 0.12 % MT SOLN: 15.0000 mL | Freq: Once | OROMUCOSAL | Status: AC

## 2021-09-27 MED ORDER — GABAPENTIN 300 MG PO CAPS: 300.0000 mg | ORAL_CAPSULE | ORAL | Status: AC

## 2021-09-27 MED ORDER — ONDANSETRON HCL 4 MG/2ML IJ SOLN
INTRAMUSCULAR | Status: DC | PRN
  Administered 2021-09-27: 4 mg via INTRAVENOUS

## 2021-09-27 MED ORDER — DEXAMETHASONE SODIUM PHOSPHATE 10 MG/ML IJ SOLN
INTRAMUSCULAR | Status: DC | PRN
Start: 2021-09-27 — End: 2021-09-27
  Administered 2021-09-27: 8 mg via INTRAVENOUS

## 2021-09-27 SURGICAL SUPPLY — 62 items
ADH SKN CLS APL DERMABOND .7 (GAUZE/BANDAGES/DRESSINGS) ×1
APL PRP STRL LF DISP 70% ISPRP (MISCELLANEOUS) ×1
APPLIER CLIP 9.375 MED OPEN (MISCELLANEOUS) ×3
APR CLP MED 9.3 20 MLT OPN (MISCELLANEOUS) ×1
BAG COUNTER SPONGE SURGICOUNT (BAG) ×2 IMPLANT
BAG SPNG CNTER NS LX DISP (BAG) ×1
BAG SURGICOUNT SPONGE COUNTING (BAG) ×1
BINDER BREAST LRG (GAUZE/BANDAGES/DRESSINGS) IMPLANT
BINDER BREAST XLRG (GAUZE/BANDAGES/DRESSINGS) IMPLANT
BIOPATCH RED 1 DISK 7.0 (GAUZE/BANDAGES/DRESSINGS) ×2 IMPLANT
BIOPATCH RED 1IN DISK 7.0MM (GAUZE/BANDAGES/DRESSINGS) ×1
CANISTER SUCT 3000ML PPV (MISCELLANEOUS) ×3 IMPLANT
CHLORAPREP W/TINT 26 (MISCELLANEOUS) ×3 IMPLANT
CLIP APPLIE 9.375 MED OPEN (MISCELLANEOUS) ×1 IMPLANT
CNTNR URN SCR LID CUP LEK RST (MISCELLANEOUS) ×1 IMPLANT
CONT SPEC 4OZ STRL OR WHT (MISCELLANEOUS) ×3
COVER PROBE W GEL 5X96 (DRAPES) ×3 IMPLANT
COVER SURGICAL LIGHT HANDLE (MISCELLANEOUS) ×3 IMPLANT
DERMABOND ADVANCED (GAUZE/BANDAGES/DRESSINGS) ×2
DERMABOND ADVANCED .7 DNX12 (GAUZE/BANDAGES/DRESSINGS) ×1 IMPLANT
DEVICE DISSECT PLASMABLAD 3.0S (MISCELLANEOUS) IMPLANT
DRAIN CHANNEL 19F RND (DRAIN) ×3 IMPLANT
DRAPE CHEST BREAST 15X10 FENES (DRAPES) ×3 IMPLANT
DRSG PAD ABDOMINAL 8X10 ST (GAUZE/BANDAGES/DRESSINGS) ×3 IMPLANT
DRSG TEGADERM 4X4.75 (GAUZE/BANDAGES/DRESSINGS) ×3 IMPLANT
ELECT CAUTERY BLADE 6.4 (BLADE) ×3 IMPLANT
ELECT REM PT RETURN 9FT ADLT (ELECTROSURGICAL) ×3
ELECTRODE REM PT RTRN 9FT ADLT (ELECTROSURGICAL) ×1 IMPLANT
EVACUATOR SILICONE 100CC (DRAIN) ×3 IMPLANT
FILTER IN LINE W/DETACHED HOSE (FILTER) ×3 IMPLANT
GAUZE SPONGE 4X4 12PLY STRL (GAUZE/BANDAGES/DRESSINGS) ×2 IMPLANT
GLOVE SURG ENC MOIS LTX SZ7.5 (GLOVE) ×3 IMPLANT
GOWN STRL REUS W/ TWL LRG LVL3 (GOWN DISPOSABLE) ×2 IMPLANT
GOWN STRL REUS W/TWL LRG LVL3 (GOWN DISPOSABLE) ×6
KIT BASIN OR (CUSTOM PROCEDURE TRAY) ×3 IMPLANT
KIT TURNOVER KIT B (KITS) ×3 IMPLANT
LIGHT WAVEGUIDE WIDE FLAT (MISCELLANEOUS) IMPLANT
NDL 18GX1X1/2 (RX/OR ONLY) (NEEDLE) IMPLANT
NDL FILTER BLUNT 18X1 1/2 (NEEDLE) IMPLANT
NDL HYPO 25GX1X1/2 BEV (NEEDLE) IMPLANT
NEEDLE 18GX1X1/2 (RX/OR ONLY) (NEEDLE) IMPLANT
NEEDLE FILTER BLUNT 18X 1/2SAF (NEEDLE)
NEEDLE FILTER BLUNT 18X1 1/2 (NEEDLE) IMPLANT
NEEDLE HYPO 25GX1X1/2 BEV (NEEDLE) IMPLANT
NS IRRIG 1000ML POUR BTL (IV SOLUTION) ×3 IMPLANT
PACK GENERAL/GYN (CUSTOM PROCEDURE TRAY) ×3 IMPLANT
PAD ARMBOARD 7.5X6 YLW CONV (MISCELLANEOUS) ×3 IMPLANT
PENCIL SMOKE EVACUATOR (MISCELLANEOUS) ×1 IMPLANT
PLASMABLADE 3.0S (MISCELLANEOUS) ×3
SPECIMEN JAR X LARGE (MISCELLANEOUS) ×3 IMPLANT
SUT ETHILON 3 0 FSL (SUTURE) ×3 IMPLANT
SUT MNCRL AB 4-0 PS2 18 (SUTURE) ×3 IMPLANT
SUT VIC AB 0 CT1 27 (SUTURE)
SUT VIC AB 0 CT1 27XBRD ANBCTR (SUTURE) ×2 IMPLANT
SUT VIC AB 3-0 54X BRD REEL (SUTURE) IMPLANT
SUT VIC AB 3-0 BRD 54 (SUTURE)
SUT VIC AB 3-0 SH 18 (SUTURE) ×3 IMPLANT
SYR CONTROL 10ML LL (SYRINGE) IMPLANT
TOWEL GREEN STERILE (TOWEL DISPOSABLE) ×3 IMPLANT
TOWEL GREEN STERILE FF (TOWEL DISPOSABLE) ×3 IMPLANT
TUBE CONNECTING 12'X1/4 (SUCTIONS) ×1
TUBE CONNECTING 12X1/4 (SUCTIONS) ×1 IMPLANT

## 2021-09-27 NOTE — H&P (Signed)
REFERRING PHYSICIAN: Rulon Eisenmenger, MD  PROVIDER: Landry Corporal, MD  MRN: [id number] DOB: November 04, 1956 Subjective  Chief Complaint: Breast Cancer   History of Present Illness: Christine Lozano is a 65 y.o. female who is seen today as an office consultation at the request of Dr. Lindi Adie for evaluation of Breast Cancer .   We are asked to see the patient in consultation by Dr. Lindi Adie to evaluate her for a right breast cancer. The patient is a 65 year old black female who first noticed some pain in the right breast. She has a history of right breast cancer that was treated with lumpectomy, chemotherapy, and radiation therapy. She was imaged and found to have a 2.1 cm mass centrally in the right breast. The axilla looked negative. 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 There was a second area in the right breast that was seen on MRI that has not been biopsied yet. She is otherwise in pretty good health. She does not smoke.  Review of Systems: A complete review of systems was obtained from the patient. I have reviewed this information and discussed as appropriate with the patient. See HPI as well for other ROS.  ROS   Medical History: Past Medical History:  Diagnosis Date   History of cancer   Hypertension   Patient Active Problem List  Diagnosis   Malignant neoplasm of female breast (CMS-HCC)   Hypertensive disorder   Meige syndrome (blepharospasm with oromandibular dystonia)   Past Surgical History:  Procedure Laterality Date   breast cyst excision Left  1974, 1980   breast lumpectomy N/A  Date Unknown   Bunionectomy Left  Date unknown    No Known Allergies  Current Outpatient Medications on File Prior to Visit  Medication Sig Dispense Refill   amLODIPine (NORVASC) 10 MG tablet Take 5 mg by mouth once daily Patient states she takes 5 mg once daily.   calcium carbonate  (CALCIUM 500 ORAL) Take by mouth   cholecalciferol (VITAMIN D3) 400 unit tablet Take 800 Units by mouth once daily   cyanocobalamin (VITAMIN B12) 1000 MCG tablet Take 1 tablet (1,000 mcg total) by mouth once daily 30 tablet 11   No current facility-administered medications on file prior to visit.   Family History  Problem Relation Age of Onset   High blood pressure (Hypertension) Father   High blood pressure (Hypertension) Brother   Diabetes Brother    Social History   Tobacco Use  Smoking Status Never Smoker  Smokeless Tobacco Never Used    Social History   Socioeconomic History   Marital status: Married  Tobacco Use   Smoking status: Never Smoker   Smokeless tobacco: Never Used  Scientific laboratory technician Use: Never used  Substance and Sexual Activity   Alcohol use: Never   Drug use: Never   Objective:  There were no vitals filed for this visit.  There is no height or weight on file to calculate BMI.  Physical Exam Vitals reviewed.  Constitutional:  General: She is not in acute distress. Appearance: Normal appearance.  HENT:  Head: Normocephalic and atraumatic.  Right Ear: External ear normal.  Left Ear: External ear normal.  Nose: Nose normal.  Mouth/Throat:  Mouth: Mucous membranes are moist.  Pharynx: Oropharynx is clear.  Eyes:  General: No scleral icterus. Extraocular Movements: Extraocular movements intact.  Conjunctiva/sclera: Conjunctivae normal.  Pupils: Pupils are equal, round, and reactive to light.  Cardiovascular:  Rate and Rhythm: Normal rate and regular rhythm.  Pulses: Normal pulses.  Heart sounds: Normal heart sounds.  Pulmonary:  Effort: Pulmonary effort is normal. No respiratory distress.  Breath sounds: Normal breath sounds.  Abdominal:  General: Bowel sounds are normal.  Palpations: Abdomen is soft.  Tenderness: There is no abdominal tenderness.  Musculoskeletal:  General: No swelling, tenderness or deformity. Normal range of motion.   Cervical back: Normal range of motion and neck supple.  Skin: General: Skin is warm and dry.  Coloration: Skin is not jaundiced.  Neurological:  General: No focal deficit present.  Mental Status: She is alert and oriented to person, place, and time.  Psychiatric:  Mood and Affect: Mood normal.  Behavior: Behavior normal.   Breast: There is a firm palpable scar just beneath the areola of the right breast. She has multiple other well-healed scars on both breasts that are soft. Other than the firm scar there is no other palpable mass in either breast. There is no palpable axillary, supraclavicular, or cervical lymphadenopathy.  Labs, Imaging and Diagnostic Testing:  Assessment and Plan:  Diagnoses and all orders for this visit:  Malignant neoplasm of central portion of right breast in female, estrogen receptor positive (CMS-HCC) - [facility] Case Posting Request; Future - Ambulatory Referral to Oncology-Medical - Ambulatory Referral to Radiation Oncology    The patient appears to have a new cancer in the central portion of the right breast with a history of previous breast cancer treated with radiation. Because of this history my recommendation would be for mastectomy. She is a good candidate for sentinel node biopsy. I have discussed with her in detail the risks and benefits of the operation as well as some of the technical aspects and she understands and wishes to proceed. She is very interested in reconstruction as well so we will refer her to plastic surgery. She will talk with medical and radiation oncology about adjuvant therapy. She will also be evaluated by physical therapy for preoperative lymphedema testing

## 2021-09-27 NOTE — Anesthesia Postprocedure Evaluation (Signed)
Anesthesia Post Note  Patient: Christine Lozano  Procedure(s) Performed: RIGHT MASTECTOMY WITH SENTINEL LYMPH NODE BIOPSY (Right: Breast)     Patient location during evaluation: PACU Anesthesia Type: General Level of consciousness: awake and alert Pain management: pain level controlled Vital Signs Assessment: post-procedure vital signs reviewed and stable Respiratory status: spontaneous breathing, nonlabored ventilation and respiratory function stable Cardiovascular status: blood pressure returned to baseline and stable Postop Assessment: no apparent nausea or vomiting Anesthetic complications: no   No notable events documented.  Last Vitals:  Vitals:   09/27/21 1030 09/27/21 1045  BP: 127/71 139/79  Pulse: 61 (!) 56  Resp: 15 14  Temp:    SpO2: 100% 100%    Last Pain:  Vitals:   09/27/21 1045  TempSrc:   PainSc: Asleep                 Catalina Gravel

## 2021-09-27 NOTE — Anesthesia Procedure Notes (Signed)
Anesthesia Regional Block: Pectoralis block   Pre-Anesthetic Checklist: , timeout performed,  Correct Patient, Correct Site, Correct Laterality,  Correct Procedure, Correct Position, site marked,  Risks and benefits discussed,  Surgical consent,  Pre-op evaluation,  At surgeon's request and post-op pain management  Laterality: Right  Prep: chloraprep       Needles:  Injection technique: Single-shot  Needle Type: Echogenic Needle     Needle Length: 9cm  Needle Gauge: 21     Additional Needles:   Procedures:,,,, ultrasound used (permanent image in chart),,    Narrative:  Start time: 09/27/2021 7:58 AM End time: 09/27/2021 8:05 AM Injection made incrementally with aspirations every 5 mL.  Performed by: Personally  Anesthesiologist: Catalina Gravel, MD  Additional Notes: No pain on injection. No increased resistance to injection. Injection made in 5cc increments.  Good needle visualization.  Patient tolerated procedure well.

## 2021-09-27 NOTE — Transfer of Care (Signed)
Immediate Anesthesia Transfer of Care Note  Patient: Christine Lozano  Procedure(s) Performed: RIGHT MASTECTOMY WITH SENTINEL LYMPH NODE BIOPSY (Right: Breast)  Patient Location: PACU  Anesthesia Type:General  Level of Consciousness: lethargic and responds to stimulation  Airway & Oxygen Therapy: Patient Spontanous Breathing and Patient connected to nasal cannula oxygen  Post-op Assessment: Report given to RN  Post vital signs: Reviewed and stable  Last Vitals:  Vitals Value Taken Time  BP 126/72 09/27/21 0957  Temp    Pulse 65 09/27/21 0958  Resp 20 09/27/21 0958  SpO2 100 % 09/27/21 0958  Vitals shown include unvalidated device data.  Last Pain:  Vitals:   09/27/21 0701  TempSrc:   PainSc: 0-No pain         Complications: No notable events documented.

## 2021-09-27 NOTE — Anesthesia Procedure Notes (Signed)
Procedure Name: LMA Insertion Date/Time: 09/27/2021 8:39 AM Performed by: Barrington Ellison, CRNA Pre-anesthesia Checklist: Patient identified, Emergency Drugs available, Suction available and Patient being monitored Patient Re-evaluated:Patient Re-evaluated prior to induction Oxygen Delivery Method: Circle System Utilized Preoxygenation: Pre-oxygenation with 100% oxygen Induction Type: IV induction Ventilation: Mask ventilation without difficulty LMA: LMA inserted LMA Size: 4.0 Number of attempts: 1 Placement Confirmation: positive ETCO2 Tube secured with: Tape Dental Injury: Teeth and Oropharynx as per pre-operative assessment

## 2021-09-27 NOTE — Op Note (Signed)
09/27/2021  9:47 AM  PATIENT:  Christine Lozano  65 y.o. female  PRE-OPERATIVE DIAGNOSIS:  RIGHT BREAST CANCER  POST-OPERATIVE DIAGNOSIS:  RIGHT BREAST CANCER  PROCEDURE:  Procedure(s): RIGHT MASTECTOMY WITH DEEP RIGHT AXILLARY SENTINEL LYMPH NODE BIOPSY (Right)  SURGEON:  Surgeon(s) and Role:    * Jovita Kussmaul, MD - Primary  PHYSICIAN ASSISTANT:   ASSISTANTS: none   ANESTHESIA:   general  EBL:  minimal   BLOOD ADMINISTERED:none  DRAINS: (1) Blake drain(s) in the prepectoral space    LOCAL MEDICATIONS USED:  NONE  SPECIMEN:  Source of Specimen:  right mastectomy with sentinel node x 1  DISPOSITION OF SPECIMEN:  PATHOLOGY  COUNTS:  YES  TOURNIQUET:  * No tourniquets in log *  DICTATION: .Dragon Dictation  After informed consent was obtained the patient was brought to the operating room and placed in the supine position on the operating table.  After adequate induction of general anesthesia the patient's right chest, breast, and axillary area were prepped with ChloraPrep, allowed to dry, and draped in usual sterile manner  An appropriate timeout was performed.  Earlier in the day the patient underwent injection of 1 mCi of technetium sulfur colloid in the subareolar position on the right.  The neoprobe was set to technetium and there was a faint signal in the right axilla.  An elliptical incision was made around the nipple and areolar complex with a 10 blade knife in order to minimize the excess skin.  The incision was carried through the skin and subcutaneous tissue sharply with the PlasmaBlade.  Breast hooks were used to elevate the skin flaps anteriorly towards the ceiling.  Thin skin flaps were then created by dissecting between the breast tissue and the subcutaneous fat and skin.  This dissection was carried all the way to the chest wall circumferentially.  Next the breast was removed from the pectoralis muscle with the pectoralis fashion.  Once this was accomplished the  entire right breast was removed from the patient.  It was marked with a stitch on the lateral skin and sent to pathology for further evaluation.  The deep right axillary space was evaluated with the neoprobe and the faint signal was identified.  I was able to also identify a palpable node where the signal was.  This node was excised sharply with the PlasmaBlade and the surrounding small vessels and lymphatics were controlled with clips.  Ex vivo counts on this node were approximately 20.  No other hot or palpable nodes were identified in the right axilla.  Hemostasis was achieved using the PlasmaBlade.  The wound was irrigated with saline.  A couple small perforating vessels medially were controlled with figure-of-eight 3-0 Vicryl stitches.  A small stab incision was made near the anterior left axillary line inferior to the operative bed.  A tonsil clamp was placed through this opening and used to bring a 19 [place] round Blake drain into the operative bed.  The drain was curled along the chest wall.  The drain was anchored to the skin with a 3-0 nylon stitch.  Next the superior and inferior skin flaps were grossly reapproximated with interrupted 3-0 Vicryl stitches.  The skin was then closed with a running 4-0 Monocryl subcuticular stitch.  Dermabond dressings and sterile drain dressings were applied.  The patient tolerated the procedure well.  At the end of the case all needle sponge and instrument counts were correct.  The patient was then awakened and taken recovery in stable  condition.  PLAN OF CARE: Admit for overnight observation  PATIENT DISPOSITION:  PACU - hemodynamically stable.   Delay start of Pharmacological VTE agent (>24hrs) due to surgical blood loss or risk of bleeding: no

## 2021-09-27 NOTE — Interval H&P Note (Signed)
History and Physical Interval Note:  09/27/2021 8:09 AM  Christine Lozano  has presented today for surgery, with the diagnosis of RIGHT BREAST CANCER.  The various methods of treatment have been discussed with the patient and family. After consideration of risks, benefits and other options for treatment, the patient has consented to  Procedure(s): RIGHT MASTECTOMY WITH SENTINEL LYMPH NODE BIOPSY (Right) as a surgical intervention.  The patient's history has been reviewed, patient examined, no change in status, stable for surgery.  I have reviewed the patient's chart and labs.  Questions were answered to the patient's satisfaction.     Autumn Messing III

## 2021-09-28 ENCOUNTER — Encounter (HOSPITAL_COMMUNITY): Payer: Self-pay | Admitting: General Surgery

## 2021-09-28 DIAGNOSIS — C50111 Malignant neoplasm of central portion of right female breast: Secondary | ICD-10-CM | POA: Diagnosis not present

## 2021-09-28 NOTE — Progress Notes (Signed)
1 Day Post-Op   Subjective/Chief Complaint: Complains of soreness   Objective: Vital signs in last 24 hours: Temp:  [97 F (36.1 C)-97.9 F (36.6 C)] 97.7 F (36.5 C) (11/08 0738) Pulse Rate:  [54-65] 54 (11/08 0738) Resp:  [10-20] 16 (11/08 0738) BP: (111-145)/(65-82) 118/65 (11/08 0738) SpO2:  [96 %-100 %] 99 % (11/08 0738)    Intake/Output from previous day: 11/07 0701 - 11/08 0700 In: 472.7 [P.O.:240; I.V.:7.7; IV Piggyback:100] Out: 105 [Drains:55; Blood:50] Intake/Output this shift: No intake/output data recorded.  General appearance: alert and cooperative Resp: clear to auscultation bilaterally Chest wall: skin flaps look good Cardio: regular rate and rhythm GI: soft, non-tender; bowel sounds normal; no masses,  no organomegaly  Lab Results:  No results for input(s): WBC, HGB, HCT, PLT in the last 72 hours. BMET No results for input(s): NA, K, CL, CO2, GLUCOSE, BUN, CREATININE, CALCIUM in the last 72 hours. PT/INR No results for input(s): LABPROT, INR in the last 72 hours. ABG No results for input(s): PHART, HCO3 in the last 72 hours.  Invalid input(s): PCO2, PO2  Studies/Results: NM Sentinel Node Inj-No Rpt (Breast)  Result Date: 09/27/2021 Sulfur Colloid was injected by the Nuclear Medicine Technologist for sentinel lymph node localization.    Anti-infectives: Anti-infectives (From admission, onward)    Start     Dose/Rate Route Frequency Ordered Stop   09/27/21 0715  ceFAZolin (ANCEF) IVPB 2g/100 mL premix        2 g 200 mL/hr over 30 Minutes Intravenous On call to O.R. 09/27/21 0708 09/27/21 0840   09/27/21 0655  ceFAZolin (ANCEF) 2-4 GM/100ML-% IVPB       Note to Pharmacy: Alvy Beal   : cabinet override      09/27/21 0655 09/27/21 0844       Assessment/Plan: s/p Procedure(s): RIGHT MASTECTOMY WITH SENTINEL LYMPH NODE BIOPSY (Right) Advance diet Discharge  LOS: 0 days    Christine Lozano 09/28/2021

## 2021-09-28 NOTE — Plan of Care (Signed)
Pt doing well. Pt given D/C instructions with verbal understanding. Rx's were sent to the pharmacy by MD. Pt's incision is clean and dry with no sign of infection. Pt D/C'd home with JP drain per MD order, instructs on how to care for drain were given. Pt's IV was removed prior to D/C. Pt D/C'd home via wheelchair per MD order. Pt is stable @ D/C and has no other needs at this time. Holli Humbles, RN

## 2021-09-30 ENCOUNTER — Telehealth: Payer: Self-pay | Admitting: *Deleted

## 2021-09-30 NOTE — Telephone Encounter (Signed)
Spoke to pt after surgery. Relate doing well. No longer taking hydrocodone. Discussed continuing walking program as well as lung health with incentive spirometer. Received verbal understanding. Discussed post op appt with Dr. Lindi Adie. Encourage pt to call with further needs or questions. Received verbal understanding.

## 2021-10-01 LAB — SURGICAL PATHOLOGY

## 2021-10-04 NOTE — Progress Notes (Signed)
Patient Care Team: Fanny Bien, MD as PCP - General (Family Medicine) Calvert Cantor, MD as Consulting Physician (Ophthalmology) Jovita Kussmaul, MD as Consulting Physician (General Surgery) Nicholas Lose, MD as Consulting Physician (Hematology and Oncology) Eppie Gibson, MD as Attending Physician (Radiation Oncology) Mauro Kaufmann, RN as Oncology Nurse Navigator Rockwell Germany, RN as Oncology Nurse Navigator  DIAGNOSIS:    ICD-10-CM   1. Malignant neoplasm of lower-inner quadrant of right breast of female, estrogen receptor positive ([place])  C50.311    Z17.0       SUMMARY OF ONCOLOGIC HISTORY: Oncology History  Malignant neoplasm of lower-inner quadrant of right female breast ([place])  08/04/2021 Initial Diagnosis   2001: Lumpectomy and radiation right breast Current: Palpable thickening in the right breast retroareolar, ultrasound showed mass at 4:00: 1 cm, axilla negative.  MRI showed 2.1 cm retroareolar mass plus additional 1.2 cm mass (not biopsied), biopsy revealed grade 2 IDC prognostic panel pending   08/11/2021 Cancer Staging   Staging form: Breast, AJCC 8th Edition - Clinical: Stage IB (cT2, cN0, cM0, G2, ER+, PR+, HER2-) - Signed by Nicholas Lose, MD on 08/11/2021 Histologic grading system: 3 grade system    09/27/2021 Surgery   Right mastectomy: Grade 2 IDC 2.1 cm with high-grade DCIS, separate focus of DCIS intermediate grade 1 cm involving CSL, margins negative, 0/2 lymph nodes negative, ER 95%, PR 90%, HER2 negative, Ki-67 10%     CHIEF COMPLIANT: Follow-up of right breast cancer  INTERVAL HISTORY: Christine Lozano is a 65 y.o. with above-mentioned history of right breast cancer. 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 She presents to the clinic today for follow-up.  The drain has come out and she is recovering very well from  surgery.  ALLERGIES:  has No Known Allergies.  MEDICATIONS:  Current Outpatient Medications  Medication Sig Dispense Refill   amLODipine (NORVASC) 5 MG tablet Take 5 mg by mouth daily.     anastrozole (ARIMIDEX) 1 MG tablet Take 1 tablet (1 mg total) by mouth daily. 90 tablet 0   Calcium Carb-Cholecalciferol (CALCIUM 1000 + D PO) Take 2 tablets by mouth in the morning and at bedtime.     Cholecalciferol (VITAMIN D3) 10 MCG/ML LIQD Take 2,000 Units by mouth daily.     HYDROcodone-acetaminophen (NORCO/VICODIN) 5-325 MG tablet Take 1 tablet by mouth every 6 (six) hours as needed for moderate pain or severe pain. 15 tablet 0   incobotulinumtoxinA (XEOMIN) 50 units SOLR injection Inject 50 Units into the muscle every 3 (three) months. 1 each 3   methocarbamol (ROBAXIN) 500 MG tablet Take 1 tablet (500 mg total) by mouth every 6 (six) hours as needed for muscle spasms. 30 tablet 3   trihexyphenidyl (ARTANE) 2 MG tablet Take 1 tablet (2 mg total) by mouth 3 (three) times daily with meals. (Patient not taking: Reported on 09/22/2021) 90 tablet 3   Current Facility-Administered Medications  Medication Dose Route Frequency Provider Last Rate Last Admin   incobotulinumtoxinA (XEOMIN) 50 units injection 50 Units  50 Units Intramuscular Q90 days Marcial Pacas, MD   50 Units at 03/16/21 0945    PHYSICAL EXAMINATION: ECOG PERFORMANCE STATUS: 1 - Symptomatic but completely ambulatory  Vitals:   10/05/21 1539  BP: 136/80  Pulse: 75  Resp: 18  Temp: 97.8 F (36.6 C)  SpO2: 100%   Filed Weights   10/05/21  1539  Weight: 136 lb 1.6 oz (61.7 kg)      LABORATORY DATA:  I have reviewed the data as listed CMP Latest Ref Rng & Units 09/23/2021 08/11/2021  Glucose 70 - 99 mg/dL 120(H) 93  BUN 8 - 23 mg/dL 6(L) 12  Creatinine 0.44 - 1.00 mg/dL 0.71 0.86  Sodium 135 - 145 mmol/L 140 142  Potassium 3.5 - 5.1 mmol/L 3.9 4.1  Chloride 98 - 111 mmol/L 107 106  CO2 22 - 32 mmol/L 29 27  Calcium 8.9 - 10.3  mg/dL 9.0 9.3  Total Protein 6.5 - 8.1 g/dL - 7.6  Total Bilirubin 0.3 - 1.2 mg/dL - 0.5  Alkaline Phos 38 - 126 U/L - 87  AST 15 - 41 U/L - 20  ALT 0 - 44 U/L - 13    Lab Results  Component Value Date   WBC 5.0 09/23/2021   HGB 12.3 09/23/2021   HCT 38.5 09/23/2021   MCV 86.3 09/23/2021   PLT 197 09/23/2021   NEUTROABS 1.7 08/11/2021    ASSESSMENT & PLAN:  Malignant neoplasm of lower-inner quadrant of right female breast ([place]) 08/04/2021: Palpable thickening in the right breast retroareolar, ultrasound showed mass at 4:00: 1 cm, axilla negative.  MRI showed 2.1 cm retroareolar mass plus additional 1.2 cm mass (not biopsied), biopsy revealed grade 2 IDC ER 95%, PR 90%, HER2 negative, Ki-67 10%  09/27/2021: Right mastectomy: Grade 2 IDC 2.1 cm with high-grade DCIS, separate focus of DCIS intermediate grade 1 cm involving CSL, margins negative, 0/2 lymph nodes negative, ER 95%, PR 90%, HER2 negative, Ki-67 10%  Pathology counseling: I discussed the final pathology report of the patient provided  a copy of this report. I discussed the margins as well as lymph node surgeries. We also discussed the final staging along with previously performed ER/PR and HER-2/neu testing.  Treatment plan: 1. Oncotype DX to see if she would benefit from chemotherapy 2. Adjuvant antiestrogen therapy (patient started anastrozole prior to surgery and she will continue with the same)  Return to clinic based upon Oncotype DX test result If she is low risk then she can be seen by survivorship care plan visit in 3 months.   No orders of the defined types were placed in this encounter.  The patient has a good understanding of the overall plan. she agrees with it. she will call with any problems that may develop before the next visit here.  Total time spent: 30 mins including face to face time and time spent for planning, charting and coordination of care  Rulon Eisenmenger, MD, MPH 10/05/2021  I, Thana Ates, am acting as scribe for Dr. Nicholas Lose.  I have reviewed the above documentation for accuracy and completeness, and I agree with the above.

## 2021-10-05 ENCOUNTER — Inpatient Hospital Stay: Payer: Medicare Other | Admitting: Hematology and Oncology

## 2021-10-05 ENCOUNTER — Other Ambulatory Visit: Payer: Self-pay

## 2021-10-05 ENCOUNTER — Inpatient Hospital Stay: Payer: Medicare Other | Attending: Hematology and Oncology | Admitting: Hematology and Oncology

## 2021-10-05 DIAGNOSIS — Z170 Estrogen receptor positive status [ER+]: Secondary | ICD-10-CM | POA: Insufficient documentation

## 2021-10-05 DIAGNOSIS — C50311 Malignant neoplasm of lower-inner quadrant of right female breast: Secondary | ICD-10-CM | POA: Insufficient documentation

## 2021-10-05 NOTE — Assessment & Plan Note (Signed)
08/04/2021: Palpable thickening in the right breast retroareolar, ultrasound showed mass at 4:00: 1 cm, axilla negative.  MRI showed 2.1 cm retroareolar mass plus additional 1.2 cm mass (not biopsied), biopsy revealed grade 2 IDC ER 95%, PR 90%, HER2 negative, Ki-67 10%  09/27/2021: Right mastectomy: Grade 2 IDC 2.1 cm with high-grade DCIS, separate focus of DCIS intermediate grade 1 cm involving CSL, margins negative, 0/2 lymph nodes negative, ER 95%, PR 90%, HER2 negative, Ki-67 10%  Pathology counseling: I discussed the final pathology report of the patient provided  a copy of this report. I discussed the margins as well as lymph node surgeries. We also discussed the final staging along with previously performed ER/PR and HER-2/neu testing.  Treatment plan: 1. Oncotype DX to see if she would benefit from chemotherapy 2. Adjuvant antiestrogen therapy   Return to clinic based upon Oncotype DX test result

## 2021-10-06 ENCOUNTER — Encounter: Payer: Self-pay | Admitting: *Deleted

## 2021-10-06 ENCOUNTER — Other Ambulatory Visit: Payer: Self-pay | Admitting: *Deleted

## 2021-10-06 DIAGNOSIS — Z170 Estrogen receptor positive status [ER+]: Secondary | ICD-10-CM

## 2021-10-18 ENCOUNTER — Ambulatory Visit: Payer: Medicare Other | Attending: General Surgery | Admitting: Physical Therapy

## 2021-10-18 ENCOUNTER — Encounter: Payer: Self-pay | Admitting: Physical Therapy

## 2021-10-18 ENCOUNTER — Other Ambulatory Visit: Payer: Self-pay

## 2021-10-18 DIAGNOSIS — R293 Abnormal posture: Secondary | ICD-10-CM | POA: Insufficient documentation

## 2021-10-18 DIAGNOSIS — C50311 Malignant neoplasm of lower-inner quadrant of right female breast: Secondary | ICD-10-CM | POA: Diagnosis present

## 2021-10-18 DIAGNOSIS — Z170 Estrogen receptor positive status [ER+]: Secondary | ICD-10-CM | POA: Insufficient documentation

## 2021-10-18 DIAGNOSIS — M25611 Stiffness of right shoulder, not elsewhere classified: Secondary | ICD-10-CM | POA: Insufficient documentation

## 2021-10-18 DIAGNOSIS — Z483 Aftercare following surgery for neoplasm: Secondary | ICD-10-CM | POA: Diagnosis present

## 2021-10-18 NOTE — Patient Instructions (Addendum)
[facility]  [address], [address]  [place] [address]  [phone]  After Breast Cancer Class It is recommended you attend the ABC class to be educated on lymphedema risk reduction. This class is free of charge and lasts for 1 hour. It is a 1-time class. You will need to download the Webex app either on your phone or computer. We will send you a link the night before or the morning of the class. You should be able to click on that link to join the class. This is not a confidential class. You don't have to turn your camera on, but other participants may be able to see your email address. You are scheduled for December 5th at 11:00.  Scar massage You can begin gentle scar massage to you incision sites. Gently place one hand on the incision and move the skin (without sliding on the skin) in various directions. Do this for a few minutes and then you can gently massage either coconut oil or vitamin E cream into the scars. WAIT to do this until your incision is more healed (probably 2 more weeks from today).  Home exercise Program Continue doing the exercises you were given until you feel like you can do them without feeling any tightness at the end.  We will be adding on in therapy as well..  Walking Program Studies show that 30 minutes of walking per day (fast enough to elevate your heart rate) can significantly reduce the risk of a cancer recurrence. If you can't walk due to other medical reasons, we encourage you to find another activity you could do (like a stationary bike or water exercise).  Posture After breast cancer surgery, people frequently sit with rounded shoulders posture because it puts their incisions on slack and feels better. If you sit like this and scar tissue forms in that position, you can become very tight and have pain sitting or standing with good posture. Try to be aware of your posture and sit and stand up tall to heal properly.  Follow up  PT: It is recommended you return every 3 months for the first 3 years following surgery to be assessed on the SOZO machine for an L-Dex score. This helps prevent clinically significant lymphedema in 95% of patients. These follow up screens are 10 minute appointments that you are not billed for. You are scheduled for February 13th, 2023 at 10:40.  Axillary web syndrome (also called cording) can happen after having breast cancer surgery when lymph nodes in the armpit are removed. It presents as if you have a thin cord in your arm and can run from the armpit all the way down into the forearm. If you've had a sentinel node biopsy, the risk is 1-20% and if you've had an axillary lymph node dissection (more than 7 nodes removed), the risk is 36-72%. The ranges vary depending on the research study.  It most often happens 3-4 weeks post-op but can happen sooner or later. There are several possibilities for what cording actually is. Although no one knows for sure as of yet, it may be related to lymphatics, veins, or other tissue. Sometimes cording resolves on its own but other times it requires physical therapy with a therapist who specializes in lymphedema and/or cancer rehab. Treatment typically involves stretching, manual techniques, and exercise. Sometimes cords get "released" while stretching or during manual treatment and the patient may experience the sensation of a "pop." This may feel strange  but it is not dangerous and is a sign that the cord has released; range of motion may be improved in the process.

## 2021-10-18 NOTE — Therapy (Signed)
[facility] @ [place], [place], [address] Phone: [phone]   Fax:  [phone]  Physical Therapy Treatment  Patient Details  Name: Christine Lozano MRN: [id number] Date of Birth: 07/11/1956 Referring Provider (PT): Dr. Autumn Messing   Encounter Date: 10/18/2021   PT End of Session - 10/18/21 1431     Visit Number 2    Number of Visits 10    Date for PT Re-Evaluation 11/15/21    PT Start Time 6948    PT Stop Time 1440    PT Time Calculation (min) 42 min    Activity Tolerance Patient tolerated treatment well    Behavior During Therapy [facility] for tasks assessed/performed             Past Medical History:  Diagnosis Date   Facial twitching    Hypertension    Personal history of radiation therapy     Past Surgical History:  Procedure Laterality Date   BREAST BIOPSY Right 08/04/2021   BREAST CYST EXCISION Left 5462,7035   x2   BREAST LUMPECTOMY     BUNIONECTOMY Left 10 years ago   [place] Right 09/27/2021   Procedure: RIGHT MASTECTOMY WITH SENTINEL LYMPH NODE BIOPSY;  Surgeon: Jovita Kussmaul, MD;  Location: [facility];  Service: General;  Laterality: Right;   TOOTH EXTRACTION      There were no vitals filed for this visit.   Subjective Assessment - 10/18/21 1402     Subjective Patient reports she underwent a right mastectomy and sentinel node biopsy (2 negative nodes) on 09/27/2021. Her Oncotype score was low so no chemo needed. She is already on anti-estrogen therapy.    Pertinent History Patient was diagnosed on 07/29/2021 with right grade II invasive ductal carcinoma breast cancer. She underwent a right mastectomy and sentinel node biopsy (2 negative nodes) on 09/27/2021 It is ER/PR positive and HER2 negative. She has a history of right breast cancer in 2001 with a lumpectomy, sentinel node biopsy, and radiation.    Patient Stated Goals Get my arm better    Currently in Pain? Yes     Pain Score 3     Pain Location Arm    Pain Orientation Right;Posterior    Pain Descriptors / Indicators Numbness;Tingling    Pain Type Surgical pain    Pain Onset 1 to 4 weeks ago    Pain Frequency Intermittent    Aggravating Factors  Stretching arm out    Pain Relieving Factors Resting                [facility] PT Assessment - 10/18/21 0001       Assessment   Medical Diagnosis s/p right lumpectomy and SLNB    Referring Provider (PT) Dr. Autumn Messing    Onset Date/Surgical Date 09/27/21    Hand Dominance Left    Prior Therapy Baselines      Precautions   Precautions Other (comment)    Precaution Comments right arm lymphedema risk      Restrictions   Weight Bearing Restrictions No      Balance Screen   Has the patient fallen in the past 6 months No    Has the patient had a decrease in activity level because of a fear of falling?  No    Is the patient reluctant to leave their home because of a fear of falling?  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 Middle of chest incision appears warm and slightly red (see photo in media section) but she is seeing her surgeon tomorrow and will discuss.      Posture/Postural Control   Posture/Postural Control Postural limitations    Postural Limitations Rounded Shoulders;Forward head      ROM / Strength   AROM / PROM / Strength AROM      AROM   AROM Assessment Site Shoulder    Right/Left Shoulder  Right    Right Shoulder Extension 40 Degrees    Right Shoulder Flexion 81 Degrees    Right Shoulder ABduction 77 Degrees      Strength   Overall Strength Unable to assess;Due to precautions               LYMPHEDEMA/ONCOLOGY QUESTIONNAIRE - 10/18/21 0001       Type   Cancer Type Right breast cancer      Surgeries   Mastectomy Date 09/27/21    Sentinel Lymph Node Biopsy Date 09/27/21    Number Lymph Nodes Removed 2      Treatment   Active Chemotherapy Treatment No    Active Radiation Treatment No    Current Hormone Treatment Yes    Drug Name Arimidex      What other symptoms do you have   Are you Having Heaviness or Tightness Yes    Are you having Pain Yes    Are you having pitting edema No    Is it Hard or Difficult finding clothes that fit No    Do you have infections No    Is there Decreased scar mobility Yes    [place]emmer Sign No      Lymphedema Assessments   Lymphedema Assessments Upper extremities      Right Upper Extremity Lymphedema   10 cm Proximal to Olecranon Process 25.6 cm    Olecranon Process 22.8 cm    10 cm Proximal to Ulnar Styloid Process 20.2 cm    Just Proximal to Ulnar Styloid Process 14.7 cm    Across Hand at [employer] 18.5 cm    At [place] of 2nd Digit 6 cm      Left Upper Extremity Lymphedema   10 cm Proximal to Olecranon Process 26 cm    Olecranon Process 22.7 cm    10 cm Proximal to Ulnar Styloid Process 19.2 cm    Just Proximal to Ulnar Styloid Process 14.4 cm    Across Hand at [employer] 19 cm    At [place] of 2nd Digit 6 cm                Quick Dash - 10/18/21 0001     Open a tight or new jar Moderate difficulty    Do heavy household chores (wash walls, wash floors) Unable    Carry a shopping bag or briefcase Moderate difficulty  Wash your back Moderate difficulty    Use a knife to cut food Mild difficulty    Recreational activities in which you take some force or impact through your arm, shoulder, or hand  (golf, hammering, tennis) Unable    During the past week, to what extent has your arm, shoulder or hand problem interfered with your normal social activities with family, friends, neighbors, or groups? Modererately    During the past week, to what extent has your arm, shoulder or hand problem limited your work or other regular daily activities Modererately    Arm, shoulder, or hand pain. Moderate    Tingling (pins and needles) in your arm, shoulder, or hand Moderate    Difficulty Sleeping Mild difficulty    DASH Score 54.55 %                             PT Education - 10/18/21 1430     Education Details Aftercare; lymphedema education; HEP    Person(s) Educated Patient    Methods Explanation;Demonstration;Handout    Comprehension Returned demonstration;Verbalized understanding                 PT Long Term Goals - 10/18/21 1522       PT LONG TERM GOAL #1   Title Patient will demonstrate she has regained full shoulder ROM and function post op compared to baselines.    Time 4    Period Weeks    Status On-going    Target Date 11/15/21      PT LONG TERM GOAL #2   Title Patient will improve right shoulder active flexion to >= 140 degrees for improved ability to reach.    Baseline 81 post op and 145 pre op    Time 4    Period Weeks    Status New    Target Date 11/15/21      PT LONG TERM GOAL #3   Title Patient will increase right shoulder active abduction to >/= 140 degrees for increased ease tolerating daily tasks.    Baseline 77 post op; 155 pre-op    Time 4    Period Weeks    Status New    Target Date 11/15/21      PT LONG TERM GOAL #4   Title Patient will verbalize good understanding of lymphedema risk reduction practices.    Time 4    Period Weeks    Status New    Target Date 11/15/21      PT LONG TERM GOAL #5   Title Patient will imporve her DASH score to be </= 20 for improved overall UE function.    Baseline 54.55 post op; 0 pre-op     Time 4    Period Weeks    Status New    Target Date 11/15/21                   Plan - 10/18/21 1431     Clinical Impression Statement Patient is healing well s/p right mastectomy and sentinel node biopsy (2 negative nodes) on 09/27/2021. Her incision appears to be slightly red and warm in the mid portion and she has visible and palpable axillary cording extending in her anterior elbow. She has very limited shoulder ROM and function with limitation due to cording. She will benefit from PT to address these deficits and improve overall function.    PT Treatment/Interventions ADLs/Self Care Home Management;Therapeutic exercise;Patient/family education;Passive  range of motion;Manual techniques;Manual lymph drainage;Scar mobilization;Therapeutic activities    PT Next Visit Plan PROM, ROM exercises and myofascial techniques for axillary cording    PT Home Exercise Plan Post op HEP    Consulted and Agree with Plan of Care Patient             Patient will benefit from skilled therapeutic intervention in order to improve the following deficits and impairments:  Postural dysfunction, Decreased range of motion, Decreased knowledge of precautions, Impaired UE functional use, Pain, Decreased scar mobility, Increased fascial restricitons  Visit Diagnosis: Malignant neoplasm of lower-inner quadrant of right breast of female, estrogen receptor positive ([place]) - Plan: PT plan of care cert/re-cert  Abnormal posture - Plan: PT plan of care cert/re-cert  Aftercare following surgery for neoplasm - Plan: PT plan of care cert/re-cert  Stiffness of right shoulder, not elsewhere classified - Plan: PT plan of care cert/re-cert     Problem List Patient Active Problem List   Diagnosis Date Noted   Cancer of right female breast ([place]) 09/27/2021   Malignant neoplasm of lower-inner quadrant of right female breast ([place]) 08/10/2021   Meige syndrome (blepharospasm with oromandibular dystonia) 03/16/2021    Blepharospasm of both eyes 10/21/2019   Annia Friendly, PT 10/18/21 4:31 PM   [facility] @ [place], [place], [address] Phone: [phone]   Fax:  [phone]  Name: AALIYAH GAVEL MRN: [id number] Date of Birth: 1956/05/15

## 2021-10-22 ENCOUNTER — Telehealth: Payer: Self-pay | Admitting: *Deleted

## 2021-10-22 NOTE — Telephone Encounter (Signed)
Spoke to pt regarding Oncotype and ensured chemo not recommended based on 17. Pt thankful for the call and encouragement

## 2021-11-01 ENCOUNTER — Ambulatory Visit: Payer: Medicare Other | Attending: General Surgery

## 2021-11-01 ENCOUNTER — Other Ambulatory Visit: Payer: Self-pay

## 2021-11-01 DIAGNOSIS — R293 Abnormal posture: Secondary | ICD-10-CM | POA: Diagnosis present

## 2021-11-01 DIAGNOSIS — Z483 Aftercare following surgery for neoplasm: Secondary | ICD-10-CM

## 2021-11-01 DIAGNOSIS — C50311 Malignant neoplasm of lower-inner quadrant of right female breast: Secondary | ICD-10-CM | POA: Diagnosis present

## 2021-11-01 DIAGNOSIS — Z170 Estrogen receptor positive status [ER+]: Secondary | ICD-10-CM | POA: Diagnosis present

## 2021-11-01 DIAGNOSIS — M25611 Stiffness of right shoulder, not elsewhere classified: Secondary | ICD-10-CM | POA: Insufficient documentation

## 2021-11-01 NOTE — Therapy (Signed)
[facility] @ [place], [place], [address] Phone: [phone]   Fax:  [phone]  Physical Therapy Treatment  Patient Details  Name: Christine Lozano MRN: [id number] Date of Birth: 1956/03/28 Referring Provider (PT): Dr. Autumn Messing   Encounter Date: 11/01/2021   PT End of Session - 11/01/21 1710     Visit Number 3    Number of Visits 10    Date for PT Re-Evaluation 11/15/21    PT Start Time 3329    PT Stop Time 1502    PT Time Calculation (min) 54 min    Activity Tolerance Patient tolerated treatment well    Behavior During Therapy [facility] for tasks assessed/performed             Past Medical History:  Diagnosis Date   Facial twitching    Hypertension    Personal history of radiation therapy     Past Surgical History:  Procedure Laterality Date   BREAST BIOPSY Right 08/04/2021   BREAST CYST EXCISION Left 5188,4166   x2   BREAST LUMPECTOMY     BUNIONECTOMY Left 10 years ago   [place] Right 09/27/2021   Procedure: RIGHT MASTECTOMY WITH SENTINEL LYMPH NODE BIOPSY;  Surgeon: Jovita Kussmaul, MD;  Location: [facility];  Service: General;  Laterality: Right;   TOOTH EXTRACTION      There were no vitals filed for this visit.   Subjective Assessment - 11/01/21 1412     Subjective I've been doing the stretches but the cording just keeps fighting me and coming back.    Pertinent History Patient was diagnosed on 07/29/2021 with right grade II invasive ductal carcinoma breast cancer. She underwent a right mastectomy and sentinel node biopsy (2 negative nodes) on 09/27/2021 It is ER/PR positive and HER2 negative. She has a history of right breast cancer in 2001 with a lumpectomy, sentinel node biopsy, and radiation.    Patient Stated Goals Get my arm better    Currently in Pain? No/denies                               [facility] Adult PT Treatment/Exercise - 11/01/21 0001        Manual Therapy   Manual Therapy Myofascial release;Passive ROM;Manual Lymphatic Drainage (MLD)    Manual therapy comments TG soft size small issued and assisted pt with donning ot Rt UE    Myofascial Release In Supine to Rt axilla, medial upper arm and antecubital fossa where multiple cords palpable. Multiple pops noted during session today by therapist and pt    Manual Lymphatic Drainage (MLD) In Supine: short neck, Rt inguinal and Lt axillary nodes, Rt axillo-inguinal and anterior inter-axillary anastomosis, then Rt upper arm retracing all steps; done at end of session after multiple releases with cording    Passive ROM In Supine to Rt shoulder into flexion, abduction and D2 to pts available end motion                          PT Long Term Goals - 10/18/21 1522       PT LONG TERM GOAL #1   Title Patient will demonstrate she has regained full shoulder ROM and function post op compared to baselines.    Time 4    Period Weeks    Status On-going    Target Date  11/15/21      PT LONG TERM GOAL #2   Title Patient will improve right shoulder active flexion to >= 140 degrees for improved ability to reach.    Baseline 81 post op and 145 pre op    Time 4    Period Weeks    Status New    Target Date 11/15/21      PT LONG TERM GOAL #3   Title Patient will increase right shoulder active abduction to >/= 140 degrees for increased ease tolerating daily tasks.    Baseline 77 post op; 155 pre-op    Time 4    Period Weeks    Status New    Target Date 11/15/21      PT LONG TERM GOAL #4   Title Patient will verbalize good understanding of lymphedema risk reduction practices.    Time 4    Period Weeks    Status New    Target Date 11/15/21      PT LONG TERM GOAL #5   Title Patient will imporve her DASH score to be </= 20 for improved overall UE function.    Baseline 54.55 post op; 0 pre-op    Time 4    Period Weeks    Status New    Target Date 11/15/21                    Plan - 11/01/21 1714     Clinical Impression Statement First session of MFR for multiple cords at Rt UE. Multiple pops were noted during session and pt reports her UE feeling much looser and her A/ROM was improved at end of session. Educated her how perform end ROM stretching in doorway and instructed how to perform self MFR to axilla when in end range stretch. Also issued TG soft for pt to wear on Rt UE to help support lymphatic system. If she reports benefit of this will look into having her get a compression sleeve as this was her second lymph node removal surgery.    Stability/Clinical Decision Making Stable/Uncomplicated    Rehab Potential Excellent    PT Frequency 2x / week    PT Duration 4 weeks    PT Treatment/Interventions ADLs/Self Care Home Management;Therapeutic exercise;Patient/family education;Passive range of motion;Manual techniques;Manual lymph drainage;Scar mobilization;Therapeutic activities    PT Next Visit Plan PROM, ROM exercises and myofascial techniques for axillary cording    PT Home Exercise Plan Post op HEP; end ROM stretching in doorway    Consulted and Agree with Plan of Care Patient             Patient will benefit from skilled therapeutic intervention in order to improve the following deficits and impairments:  Postural dysfunction, Decreased range of motion, Decreased knowledge of precautions, Impaired UE functional use, Pain, Decreased scar mobility, Increased fascial restricitons  Visit Diagnosis: Aftercare following surgery for neoplasm     Problem List Patient Active Problem List   Diagnosis Date Noted   Cancer of right female breast ([place]) 09/27/2021   Malignant neoplasm of lower-inner quadrant of right female breast ([place]) 08/10/2021   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

## 2021-11-03 ENCOUNTER — Other Ambulatory Visit: Payer: Self-pay

## 2021-11-03 ENCOUNTER — Encounter: Payer: Self-pay | Admitting: Rehabilitation

## 2021-11-03 ENCOUNTER — Ambulatory Visit: Payer: Medicare Other | Admitting: Rehabilitation

## 2021-11-03 DIAGNOSIS — Z483 Aftercare following surgery for neoplasm: Secondary | ICD-10-CM | POA: Diagnosis not present

## 2021-11-03 DIAGNOSIS — M25611 Stiffness of right shoulder, not elsewhere classified: Secondary | ICD-10-CM

## 2021-11-03 DIAGNOSIS — C50311 Malignant neoplasm of lower-inner quadrant of right female breast: Secondary | ICD-10-CM

## 2021-11-03 DIAGNOSIS — R293 Abnormal posture: Secondary | ICD-10-CM

## 2021-11-03 DIAGNOSIS — Z170 Estrogen receptor positive status [ER+]: Secondary | ICD-10-CM

## 2021-11-03 NOTE — Therapy (Signed)
[facility] @ [place], [place], [address] Phone: [phone]   Fax:  [phone]  Physical Therapy Treatment  Patient Details  Name: Christine Lozano MRN: [id number] Date of Birth: 05/22/1956 Referring Provider (PT): Dr. Autumn Messing   Encounter Date: 11/03/2021   PT End of Session - 11/03/21 1147     Visit Number 4    Number of Visits 10    Date for PT Re-Evaluation 11/15/21    PT Start Time 1100    PT Stop Time 1148    PT Time Calculation (min) 48 min    Activity Tolerance Patient tolerated treatment well    Behavior During Therapy [facility] for tasks assessed/performed             Past Medical History:  Diagnosis Date   Facial twitching    Hypertension    Personal history of radiation therapy     Past Surgical History:  Procedure Laterality Date   BREAST BIOPSY Right 08/04/2021   BREAST CYST EXCISION Left 6659,9357   x2   BREAST LUMPECTOMY     BUNIONECTOMY Left 10 years ago   [place] Right 09/27/2021   Procedure: RIGHT MASTECTOMY WITH SENTINEL LYMPH NODE BIOPSY;  Surgeon: Jovita Kussmaul, MD;  Location: [facility];  Service: General;  Laterality: Right;   TOOTH EXTRACTION      There were no vitals filed for this visit.   Subjective Assessment - 11/03/21 1102     Subjective Not interested in a real compression sleeve    Pertinent History Patient was diagnosed on 07/29/2021 with right grade II invasive ductal carcinoma breast cancer. She underwent a right mastectomy and sentinel node biopsy (2 negative nodes) on 09/27/2021 It is ER/PR positive and HER2 negative. She has a history of right breast cancer in 2001 with a lumpectomy, sentinel node biopsy, and radiation.    Currently in Pain? No/denies                               [facility] Adult PT Treatment/Exercise - 11/03/21 0001       Manual Therapy   Myofascial Release In Supine to Rt axilla, medial upper arm and  antecubital fossa where multiple cords palpable.    Manual Lymphatic Drainage (MLD) In Supine: short neck, Rt inguinal and Lt axillary nodes, Rt axillo-inguinal and anterior inter-axillary anastomosis, then Rt upper arm retracing all steps; done at end of session after multiple releases with cording    Passive ROM In Supine to Rt shoulder into flexion, abduction and D2 to pts available end motion with blocking                          PT Long Term Goals - 10/18/21 1522       PT LONG TERM GOAL #1   Title Patient will demonstrate she has regained full shoulder ROM and function post op compared to baselines.    Time 4    Period Weeks    Status On-going    Target Date 11/15/21      PT LONG TERM GOAL #2   Title Patient will improve right shoulder active flexion to >= 140 degrees for improved ability to reach.    Baseline 81 post op and 145 pre op    Time 4    Period Weeks    Status New  Target Date 11/15/21      PT LONG TERM GOAL #3   Title Patient will increase right shoulder active abduction to >/= 140 degrees for increased ease tolerating daily tasks.    Baseline 77 post op; 155 pre-op    Time 4    Period Weeks    Status New    Target Date 11/15/21      PT LONG TERM GOAL #4   Title Patient will verbalize good understanding of lymphedema risk reduction practices.    Time 4    Period Weeks    Status New    Target Date 11/15/21      PT LONG TERM GOAL #5   Title Patient will imporve her DASH score to be </= 20 for improved overall UE function.    Baseline 54.55 post op; 0 pre-op    Time 4    Period Weeks    Status New    Target Date 11/15/21                   Plan - 11/03/21 1148     Clinical Impression Statement Pt continues with significant large cords in the axilla and smaller thin cords in the arm to the antecubital fossa.  No pops today but working on MFR/PROM and continued MLD.    PT Frequency 2x / week    PT Duration 4 weeks    PT  Treatment/Interventions ADLs/Self Care Home Management;Therapeutic exercise;Patient/family education;Passive range of motion;Manual techniques;Manual lymph drainage;Scar mobilization;Therapeutic activities    PT Next Visit Plan PROM, ROM exercises and myofascial techniques for axillary cording    Consulted and Agree with Plan of Care Patient             Patient will benefit from skilled therapeutic intervention in order to improve the following deficits and impairments:     Visit Diagnosis: Aftercare following surgery for neoplasm  Malignant neoplasm of lower-inner quadrant of right breast of female, estrogen receptor positive ([place])  Abnormal posture  Stiffness of right shoulder, not elsewhere classified     Problem List Patient Active Problem List   Diagnosis Date Noted   Cancer of right female breast ([place]) 09/27/2021   Malignant neoplasm of lower-inner quadrant of right female breast ([place]) 08/10/2021   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

## 2021-11-08 ENCOUNTER — Ambulatory Visit: Payer: Medicare Other

## 2021-11-08 ENCOUNTER — Other Ambulatory Visit: Payer: Self-pay

## 2021-11-08 DIAGNOSIS — Z483 Aftercare following surgery for neoplasm: Secondary | ICD-10-CM

## 2021-11-08 DIAGNOSIS — C50311 Malignant neoplasm of lower-inner quadrant of right female breast: Secondary | ICD-10-CM

## 2021-11-08 DIAGNOSIS — R293 Abnormal posture: Secondary | ICD-10-CM

## 2021-11-08 DIAGNOSIS — M25611 Stiffness of right shoulder, not elsewhere classified: Secondary | ICD-10-CM

## 2021-11-08 DIAGNOSIS — Z170 Estrogen receptor positive status [ER+]: Secondary | ICD-10-CM

## 2021-11-08 NOTE — Therapy (Signed)
[place] @ [place], [place], [address] Phone: [phone]   Fax:  [phone]  Physical Therapy Treatment  Patient Details  Name: Christine Lozano MRN: [id number] Date of Birth: June 15, 1956 Referring Provider (PT): Dr. Autumn Messing   Encounter Date: 11/08/2021   PT End of Session - 11/08/21 1316     Visit Number 5    Number of Visits 10    Date for PT Re-Evaluation 11/15/21    PT Start Time 1311   pt arrived late   PT Stop Time 1404    PT Time Calculation (min) 53 min    Activity Tolerance Patient tolerated treatment well    Behavior During Therapy [facility] for tasks assessed/performed             Past Medical History:  Diagnosis Date   Facial twitching    Hypertension    Personal history of radiation therapy     Past Surgical History:  Procedure Laterality Date   BREAST BIOPSY Right 08/04/2021   BREAST CYST EXCISION Left [id number]   x2   BREAST LUMPECTOMY     BUNIONECTOMY Left 10 years ago   [place] Right 09/27/2021   Procedure: RIGHT MASTECTOMY WITH SENTINEL LYMPH NODE BIOPSY;  Surgeon: Jovita Kussmaul, MD;  Location: [place];  Service: General;  Laterality: Right;   TOOTH EXTRACTION      There were no vitals filed for this visit.   Subjective Assessment - 11/08/21 1313     Subjective The cording is still there but I do think it's just a little better but my stretches are still just as challenging so I don't reallly know.    Pertinent History Patient was diagnosed on 07/29/2021 with right grade II invasive ductal carcinoma breast cancer. She underwent a right mastectomy and sentinel node biopsy (2 negative nodes) on 09/27/2021 It is ER/PR positive and HER2 negative. She has a history of right breast cancer in 2001 with a lumpectomy, sentinel node biopsy, and radiation.    Patient Stated Goals Get my arm better    Currently in Pain? No/denies                                [facility] Adult PT Treatment/Exercise - 11/08/21 0001       Manual Therapy   Myofascial Release In Supine to Rt axilla, medial upper arm and antecubital fossa where multiple cords palpable; multiple releases felt at cording at antecubital fossa and pts dimpling was less after session    Manual Lymphatic Drainage (MLD) In Supine: short neck, Rt inguinal and Lt axillary nodes, Rt axillo-inguinal and anterior inter-axillary anastomosis, then Rt upper arm retracing all steps; done at end of session after multiple releases with cording    Passive ROM In Supine to Rt shoulder into flexion, abduction and D2 to pts available end motion with blocking                          PT Long Term Goals - 10/18/21 1522       PT LONG TERM GOAL #1   Title Patient will demonstrate she has regained full shoulder ROM and function post op compared to baselines.    Time 4    Period Weeks    Status On-going    Target Date 11/15/21      PT  LONG TERM GOAL #2   Title Patient will improve right shoulder active flexion to >= 140 degrees for improved ability to reach.    Baseline 81 post op and 145 pre op    Time 4    Period Weeks    Status New    Target Date 11/15/21      PT LONG TERM GOAL #3   Title Patient will increase right shoulder active abduction to >/= 140 degrees for increased ease tolerating daily tasks.    Baseline 77 post op; 155 pre-op    Time 4    Period Weeks    Status New    Target Date 11/15/21      PT LONG TERM GOAL #4   Title Patient will verbalize good understanding of lymphedema risk reduction practices.    Time 4    Period Weeks    Status New    Target Date 11/15/21      PT LONG TERM GOAL #5   Title Patient will imporve her DASH score to be </= 20 for improved overall UE function.    Baseline 54.55 post op; 0 pre-op    Time 4    Period Weeks    Status New    Target Date 11/15/21                   Plan - 11/08/21  1316     Clinical Impression Statement Continued with MFR to areas of multiple cords. Still with thick cording that is pulling fascia in her upper arm causing small dimples today but overall this does seem to soften some by end of session as does her end Rt shoulder P/ROM. Multiples pops felt during session around antecubital fossa and her dimpling was improved in upper arm. Pt also reports had been having pain in forearm but this has resolved as well recently.    Stability/Clinical Decision Making Stable/Uncomplicated    Rehab Potential Excellent    PT Frequency 2x / week    PT Duration 4 weeks    PT Treatment/Interventions ADLs/Self Care Home Management;Therapeutic exercise;Patient/family education;Passive range of motion;Manual techniques;Manual lymph drainage;Scar mobilization;Therapeutic activities    PT Next Visit Plan PROM, ROM exercises and myofascial techniques for axillary cording    Consulted and Agree with Plan of Care Patient             Patient will benefit from skilled therapeutic intervention in order to improve the following deficits and impairments:  Postural dysfunction, Decreased range of motion, Decreased knowledge of precautions, Impaired UE functional use, Pain, Decreased scar mobility, Increased fascial restricitons  Visit Diagnosis: Aftercare following surgery for neoplasm  Malignant neoplasm of lower-inner quadrant of right breast of female, estrogen receptor positive ([place])  Abnormal posture  Stiffness of right shoulder, not elsewhere classified     Problem List Patient Active Problem List   Diagnosis Date Noted   Cancer of right female breast ([place]) 09/27/2021   Malignant 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

## 2021-11-10 ENCOUNTER — Ambulatory Visit: Payer: Medicare Other

## 2021-11-10 ENCOUNTER — Other Ambulatory Visit: Payer: Self-pay

## 2021-11-10 DIAGNOSIS — M25611 Stiffness of right shoulder, not elsewhere classified: Secondary | ICD-10-CM

## 2021-11-10 DIAGNOSIS — Z483 Aftercare following surgery for neoplasm: Secondary | ICD-10-CM

## 2021-11-10 DIAGNOSIS — Z170 Estrogen receptor positive status [ER+]: Secondary | ICD-10-CM

## 2021-11-10 DIAGNOSIS — C50311 Malignant neoplasm of lower-inner quadrant of right female breast: Secondary | ICD-10-CM

## 2021-11-10 DIAGNOSIS — R293 Abnormal posture: Secondary | ICD-10-CM

## 2021-11-10 NOTE — Therapy (Signed)
[place] @ [place], [place], [address] Phone: [phone]   Fax:  [phone]  Physical Therapy Treatment  Patient Details  Name: Christine Lozano MRN: [id number] Date of Birth: [birth year] Referring Provider (PT): Dr. Autumn Messing   Encounter Date: 11/10/2021   PT End of Session - 11/10/21 1157     Visit Number 6    Number of Visits 10    Date for PT Re-Evaluation 11/15/21    PT Start Time 1050    PT Stop Time 1155    PT Time Calculation (min) 65 min    Activity Tolerance Patient tolerated treatment well    Behavior During Therapy [facility] for tasks assessed/performed             Past Medical History:  Diagnosis Date   Facial twitching    Hypertension    Personal history of radiation therapy     Past Surgical History:  Procedure Laterality Date   BREAST BIOPSY Right 08/04/2021   BREAST CYST EXCISION Left [id number]   x2   BREAST LUMPECTOMY     BUNIONECTOMY Left 10 years ago   [place] Right 09/27/2021   Procedure: RIGHT MASTECTOMY WITH SENTINEL LYMPH NODE BIOPSY;  Surgeon: Jovita Kussmaul, MD;  Location: [place];  Service: General;  Laterality: Right;   TOOTH EXTRACTION      There were no vitals filed for this visit.   Subjective Assessment - 11/10/21 1052     Subjective I felt good after last session. My forearm is feeling so much better but I've started noticing knots along the cording that are tender.    Pertinent History Patient was diagnosed on 07/29/2021 with right grade II invasive ductal carcinoma breast cancer. She underwent a right mastectomy and sentinel node biopsy (2 negative nodes) on 09/27/2021 It is ER/PR positive and HER2 negative. She has a history of right breast cancer in 2001 with a lumpectomy, sentinel node biopsy, and radiation.    Patient Stated Goals Get my arm better                               [facility] Adult PT Treatment/Exercise -  11/10/21 0001       Manual Therapy   Myofascial Release In Supine to Rt axilla, medial upper arm and antecubital fossa where multiple cords palpable    Manual Lymphatic Drainage (MLD) In Supine: short neck, Rt inguinal and Lt axillary nodes, Rt axillo-inguinal and anterior inter-axillary anastomosis, then Rt upper arm retracing all steps, done at beginning and then finished retracing steps at end of session    Passive ROM In Supine to Rt shoulder into flexion, abduction and D2 to pts available end motion                          PT Long Term Goals - 10/18/21 1522       PT LONG TERM GOAL #1   Title Patient will demonstrate she has regained full shoulder ROM and function post op compared to baselines.    Time 4    Period Weeks    Status On-going    Target Date 11/15/21      PT LONG TERM GOAL #2   Title Patient will improve right shoulder active flexion to >= 140 degrees for improved ability to reach.    Baseline 81 post  op and 145 pre op    Time 4    Period Weeks    Status New    Target Date 11/15/21      PT LONG TERM GOAL #3   Title Patient will increase right shoulder active abduction to >/= 140 degrees for increased ease tolerating daily tasks.    Baseline 77 post op; 155 pre-op    Time 4    Period Weeks    Status New    Target Date 11/15/21      PT LONG TERM GOAL #4   Title Patient will verbalize good understanding of lymphedema risk reduction practices.    Time 4    Period Weeks    Status New    Target Date 11/15/21      PT LONG TERM GOAL #5   Title Patient will imporve her DASH score to be </= 20 for improved overall UE function.    Baseline 54.55 post op; 0 pre-op    Time 4    Period Weeks    Status New    Target Date 11/15/21                   Plan - 11/10/21 1158     Clinical Impression Statement Continued with MFR to areas of cording and Rt shoulder P/ROM. Also incorporated STM with cocoa butter to Rt UE at areas of cording  working to soften fascial restrictions. Pt continues to note slow improvement with cording feeling less restrictive in the distal aspect of her arm.    Stability/Clinical Decision Making Stable/Uncomplicated    Rehab Potential Excellent    PT Frequency 2x / week    PT Duration 4 weeks    PT Treatment/Interventions ADLs/Self Care Home Management;Therapeutic exercise;Patient/family education;Passive range of motion;Manual techniques;Manual lymph drainage;Scar mobilization;Therapeutic activities    PT Next Visit Plan Begin pulleys and ball roll up wall; cont PROM, STM, and myofascial techniques for Rt UE and axillary cording    PT Home Exercise Plan Post op HEP; end ROM stretching in doorway    Consulted and Agree with Plan of Care Patient             Patient will benefit from skilled therapeutic intervention in order to improve the following deficits and impairments:  Postural dysfunction, Decreased range of motion, Decreased knowledge of precautions, Impaired UE functional use, Pain, Decreased scar mobility, Increased fascial restricitons  Visit Diagnosis: Aftercare following surgery for neoplasm  Malignant neoplasm of lower-inner quadrant of right breast of female, estrogen receptor positive ([place])  Abnormal posture  Stiffness of right shoulder, not elsewhere classified     Problem List Patient Active Problem List   Diagnosis Date Noted   Cancer of right female breast ([place]) 09/27/2021   Malignant neoplasm of lower-inner quadrant of right female breast ([place]) 08/10/2021   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

## 2021-11-17 ENCOUNTER — Encounter: Payer: Medicare Other | Admitting: Rehabilitation

## 2021-11-17 NOTE — Addendum Note (Signed)
Addendum  created 11/17/21 0718 by Barrington Ellison, CRNA   Intraprocedure Meds edited

## 2021-11-18 ENCOUNTER — Ambulatory Visit: Payer: Medicare Other

## 2021-11-18 ENCOUNTER — Other Ambulatory Visit: Payer: Self-pay

## 2021-11-18 DIAGNOSIS — R293 Abnormal posture: Secondary | ICD-10-CM

## 2021-11-18 DIAGNOSIS — Z483 Aftercare following surgery for neoplasm: Secondary | ICD-10-CM | POA: Diagnosis not present

## 2021-11-18 DIAGNOSIS — C50311 Malignant neoplasm of lower-inner quadrant of right female breast: Secondary | ICD-10-CM

## 2021-11-18 DIAGNOSIS — M25611 Stiffness of right shoulder, not elsewhere classified: Secondary | ICD-10-CM

## 2021-11-18 NOTE — Therapy (Addendum)
[place] @ [place], [place], [address] Phone: [phone]   Fax:  [phone]  Physical Therapy Treatment  Patient Details  Name: Christine Lozano MRN: [id number] Date of Birth: [birth year] Referring Provider (PT): Dr. Autumn Messing   Encounter Date: 11/18/2021   PT End of Session - 11/18/21 1015     Visit Number 7    Number of Visits 15    Date for PT Re-Evaluation 12/16/21    PT Start Time 1009    PT Stop Time 1055    PT Time Calculation (min) 46 min    Activity Tolerance Patient tolerated treatment well    Behavior During Therapy [facility] for tasks assessed/performed             Past Medical History:  Diagnosis Date   Facial twitching    Hypertension    Personal history of radiation therapy     Past Surgical History:  Procedure Laterality Date   BREAST BIOPSY Right 08/04/2021   BREAST CYST EXCISION Left [id number]   x2   BREAST LUMPECTOMY     BUNIONECTOMY Left 10 years ago   [place] Right 09/27/2021   Procedure: RIGHT MASTECTOMY WITH SENTINEL LYMPH NODE BIOPSY;  Surgeon: Jovita Kussmaul, MD;  Location: [place];  Service: General;  Laterality: Right;   TOOTH EXTRACTION      There were no vitals filed for this visit.   Subjective Assessment - 11/18/21 1009     Subjective I still have the cord in my forearm and the knot and my wrist is tender, but I can't see anything.  The mobility is better, but the cords are fighting back. Using right arm better.  I can clean my shower door, dress better, reach higher.  Its hard to keep arm extended for a while.    Pertinent History Patient was diagnosed on 07/29/2021 with right grade II invasive ductal carcinoma breast cancer. She underwent a right mastectomy and sentinel node biopsy (2 negative nodes) on 09/27/2021 It is ER/PR positive and HER2 negative. She has a history of right breast cancer in 2001 with a lumpectomy, sentinel node biopsy, and  radiation.    Patient Stated Goals Get my arm better    Currently in Pain? No/denies    Pain Score 0-No pain                [facility] PT Assessment - 11/18/21 0001       Assessment   Medical Diagnosis s/p right lumpectomy and SLNB    Referring Provider (PT) Dr. Autumn Messing    Onset Date/Surgical Date 09/27/21    Hand Dominance Left      Prior Function   Level of Independence Independent      Cognition   Overall Cognitive Status Within Functional Limits for tasks assessed      AROM   Right Shoulder Extension 47 Degrees    Right Shoulder Flexion 136 Degrees    Right Shoulder ABduction 133 Degrees    Right Shoulder Internal Rotation 63 Degrees    Right Shoulder External Rotation 94 Degrees                   Quick Dash - 11/18/21 0001     Open a tight or new jar No difficulty    Do heavy household chores (wash walls, wash floors) Mild difficulty    Carry a shopping bag or briefcase No difficulty  Wash your back Mild difficulty    Use a knife to cut food No difficulty    Recreational activities in which you take some force or impact through your arm, shoulder, or hand (golf, hammering, tennis) Moderate difficulty    During the past week, to what extent has your arm, shoulder or hand problem interfered with your normal social activities with family, friends, neighbors, or groups? Slightly    During the past week, to what extent has your arm, shoulder or hand problem limited your work or other regular daily activities Slightly    Arm, shoulder, or hand pain. Mild    Tingling (pins and needles) in your arm, shoulder, or hand Mild    Difficulty Sleeping Moderate difficulty    DASH Score 22.73 %                    [facility] Adult PT Treatment/Exercise - 11/18/21 0001       Manual Therapy   Myofascial Release In Supine to Rt axilla, medial upper arm and antecubital fossa where multiple cords palpable    Passive ROM In Supine to Rt shoulder into flexion,  abduction and D2 to pts available end motion                          PT Long Term Goals - 11/18/21 1023       PT LONG TERM GOAL #1   Title Patient will demonstrate she has regained full shoulder ROM and function post op compared to baselines.    Baseline great progress but not full    Time 4    Period Weeks    Status On-going    Target Date 12/16/21      PT LONG TERM GOAL #2   Baseline 81 post op and 145 pre op, 136 11/18/2021    Time 4    Status On-going    Target Date 12/16/21      PT LONG TERM GOAL #3   Title Patient will increase right shoulder active abduction to >/= 140 degrees for increased ease tolerating daily tasks.    Baseline 77 post op; 155 pre-op,  137    Time 4    Period Weeks    Status On-going    Target Date 12/16/21      PT LONG TERM GOAL #4   Title Patient will verbalize good understanding of lymphedema risk reduction practices.    Time 4    Period Weeks    Status On-going    Target Date 12/16/21      PT LONG TERM GOAL #5   Title Patient will imporve her DASH score to be </= 20 for improved overall UE function.    Baseline 54.55 post op; 0 pre-op, 22.73 today 12/29    Time 4    Period Weeks    Status On-going    Target Date 12/16/21                   Plan - 11/18/21 1016     Clinical Impression Statement Pt. is making good progress with ROM and has nearly met all goals, however, she continues with limitations due to multiple cords still present in the axilla, arm and forearm.  There were 2 pops felt with MFR techniques today. A/PROM is overall improved and she felt better after treatment today.    Stability/Clinical Decision Making Stable/Uncomplicated    Rehab Potential Excellent  PT Duration 4 weeks    PT Treatment/Interventions ADLs/Self Care Home Management;Therapeutic exercise;Patient/family education;Passive range of motion;Manual techniques;Manual lymph drainage;Scar mobilization;Therapeutic activities    PT  Next Visit Plan Trunk rotation stretch/lat stretch,continue pulleys and ball roll up wall; cont PROM, STM, and myofascial techniques for Rt UE and axillary cording    PT Home Exercise Plan Post op HEP; end ROM stretching in doorway    Consulted and Agree with Plan of Care Patient             Patient will benefit from skilled therapeutic intervention in order to improve the following deficits and impairments:  Postural dysfunction, Decreased range of motion, Decreased knowledge of precautions, Impaired UE functional use, Pain, Decreased scar mobility, Increased fascial restricitons  Visit Diagnosis: Aftercare following surgery for neoplasm  Malignant neoplasm of lower-inner quadrant of right breast of female, estrogen receptor positive ([facility])  Abnormal posture  Stiffness of right shoulder, not elsewhere classified     Problem List Patient Active Problem List   Diagnosis Date Noted   Cancer of right female breast ([place]) 09/27/2021   Malignant neoplasm of lower-inner quadrant of right female breast ([place]) 08/10/2021   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

## 2021-11-19 ENCOUNTER — Ambulatory Visit: Payer: Medicare Other

## 2021-11-19 DIAGNOSIS — Z483 Aftercare following surgery for neoplasm: Secondary | ICD-10-CM | POA: Diagnosis not present

## 2021-11-19 DIAGNOSIS — C50311 Malignant neoplasm of lower-inner quadrant of right female breast: Secondary | ICD-10-CM

## 2021-11-19 DIAGNOSIS — R293 Abnormal posture: Secondary | ICD-10-CM

## 2021-11-19 DIAGNOSIS — M25611 Stiffness of right shoulder, not elsewhere classified: Secondary | ICD-10-CM

## 2021-11-19 NOTE — Therapy (Signed)
[facility] @ [place], [place], [address] Phone: [phone]   Fax:  [phone]  Physical Therapy Treatment  Patient Details  Name: Christine Lozano MRN: [id number] Date of Birth: Jun 08, 1956 Referring Provider (PT): Dr. Autumn Messing   Encounter Date: 11/19/2021   PT End of Session - 11/19/21 1205     Visit Number 8    Number of Visits 15    Date for PT Re-Evaluation 12/16/21    PT Start Time 1110    PT Stop Time 1200    PT Time Calculation (min) 50 min    Activity Tolerance Patient tolerated treatment well    Behavior During Therapy [facility] for tasks assessed/performed             Past Medical History:  Diagnosis Date   Facial twitching    Hypertension    Personal history of radiation therapy     Past Surgical History:  Procedure Laterality Date   BREAST BIOPSY Right 08/04/2021   BREAST CYST EXCISION Left 0300,9233   x2   BREAST LUMPECTOMY     BUNIONECTOMY Left 10 years ago   [place] Right 09/27/2021   Procedure: RIGHT MASTECTOMY WITH SENTINEL LYMPH NODE BIOPSY;  Surgeon: Jovita Kussmaul, MD;  Location: [facility];  Service: General;  Laterality: Right;   TOOTH EXTRACTION      There were no vitals filed for this visit.   Subjective Assessment - 11/19/21 1111     Subjective Cords felt better after yesterday.Still there but feels a little better today.    Pertinent History Patient was diagnosed on 07/29/2021 with right grade II invasive ductal carcinoma breast cancer. She underwent a right mastectomy and sentinel node biopsy (2 negative nodes) on 09/27/2021 It is ER/PR positive and HER2 negative. She has a history of right breast cancer in 2001 with a lumpectomy, sentinel node biopsy, and radiation.    Patient Stated Goals Get my arm better    Currently in Pain? No/denies    Pain Score 0-No pain    Multiple Pain Sites No                               [facility]  Adult PT Treatment/Exercise - 11/19/21 0001       Shoulder Exercises: Supine   Other Supine Exercises supine wand flexion, scaption x 5    Other Supine Exercises AROM supine flexion, scaption, horizontal abd x5     Shoulder Exercises: Standing   Other Standing Exercises single arm chest stretch/NTS      Manual Therapy   Myofascial Release In Supine to Rt axilla, medial upper arm and antecubital fossa where multiple cords palpable    Manual Lymphatic Drainage (MLD) In Supine: short neck, Rt inguinal and Lt axillary nodes, Rt axillo-inguinal and anterior inter-axillary anastomosis, then Rt upper arm retracing all steps, done at beginning and then finished retracing steps at end of session    Passive ROM In Supine to Rt shoulder into flexion, abduction and D2 to pts available end motion                          PT Long Term Goals - 11/18/21 1023       PT LONG TERM GOAL #1   Title Patient will demonstrate she has regained full shoulder ROM and function post op compared to  baselines.    Baseline great progress but not full    Time 4    Period Weeks    Status On-going    Target Date 12/16/21      PT LONG TERM GOAL #2   Baseline 81 post op and 145 pre op, 136 11/18/2021    Time 4    Status On-going    Target Date 12/16/21      PT LONG TERM GOAL #3   Title Patient will increase right shoulder active abduction to >/= 140 degrees for increased ease tolerating daily tasks.    Baseline 77 post op; 155 pre-op,  137    Time 4    Period Weeks    Status On-going    Target Date 12/16/21      PT LONG TERM GOAL #4   Title Patient will verbalize good understanding of lymphedema risk reduction practices.    Time 4    Period Weeks    Status On-going    Target Date 12/16/21      PT LONG TERM GOAL #5   Title Patient will imporve her DASH score to be </= 20 for improved overall UE function.    Baseline 54.55 post op; 0 pre-op, 22.73 today 12/29    Time 4    Period Weeks     Status On-going    Target Date 12/16/21                   Plan - 11/19/21 1207     Clinical Impression Statement Pt felt better after MFR techniques to cording yesterday.  Continued MFR throughout Right UE, followed by AAROM and AROM exs and PROM to restore full ROM, and ending with MLD to the right UE.  Pt had questions about scar massage and after looking at incision she still has a lot of glue present but no visible scabs.  She will gently wash and she was given some adhesive remover as well. Multiple cords are still very present, with 2 pops noted today with release techniques    Stability/Clinical Decision Making Stable/Uncomplicated    Rehab Potential Excellent    PT Frequency 2x / week    PT Duration 4 weeks    PT Treatment/Interventions ADLs/Self Care Home Management;Therapeutic exercise;Patient/family education;Passive range of motion;Manual techniques;Manual lymph drainage;Scar mobilization;Therapeutic activities    PT Next Visit Plan Trunk rotation stretch/lat stretch,continue pulleys and ball roll up wall; cont PROM, STM, and myofascial techniques, MLD for Rt UE and axillary cording    PT Home Exercise Plan Post op HEP; end ROM stretching in doorway, single arm chest/NTS on wall    Consulted and Agree with Plan of Care Patient             Patient will benefit from skilled therapeutic intervention in order to improve the following deficits and impairments:  Postural dysfunction, Decreased range of motion, Decreased knowledge of precautions, Impaired UE functional use, Pain, Decreased scar mobility, Increased fascial restricitons  Visit Diagnosis: Aftercare following surgery for neoplasm  Malignant neoplasm of lower-inner quadrant of right breast of female, estrogen receptor positive ([place])  Abnormal posture  Stiffness of right shoulder, not elsewhere classified     Problem List Patient Active Problem List   Diagnosis Date Noted   Cancer of right female  breast ([place]) 09/27/2021   Malignant neoplasm of lower-inner quadrant of right female breast ([place]) 08/10/2021   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

## 2021-11-19 NOTE — Addendum Note (Signed)
Addended by: Claris Pong on: 11/19/2021 12:19 PM   Modules accepted: Orders

## 2021-11-26 ENCOUNTER — Ambulatory Visit: Payer: Medicare Other | Attending: General Surgery | Admitting: Physical Therapy

## 2021-11-26 ENCOUNTER — Encounter: Payer: Self-pay | Admitting: Physical Therapy

## 2021-11-26 ENCOUNTER — Other Ambulatory Visit: Payer: Self-pay

## 2021-11-26 DIAGNOSIS — M25552 Pain in left hip: Secondary | ICD-10-CM | POA: Diagnosis present

## 2021-11-26 DIAGNOSIS — M25551 Pain in right hip: Secondary | ICD-10-CM | POA: Insufficient documentation

## 2021-11-26 DIAGNOSIS — C50311 Malignant neoplasm of lower-inner quadrant of right female breast: Secondary | ICD-10-CM | POA: Diagnosis present

## 2021-11-26 DIAGNOSIS — M25611 Stiffness of right shoulder, not elsewhere classified: Secondary | ICD-10-CM

## 2021-11-26 DIAGNOSIS — Z170 Estrogen receptor positive status [ER+]: Secondary | ICD-10-CM | POA: Diagnosis present

## 2021-11-26 DIAGNOSIS — Z483 Aftercare following surgery for neoplasm: Secondary | ICD-10-CM | POA: Diagnosis present

## 2021-11-26 DIAGNOSIS — R293 Abnormal posture: Secondary | ICD-10-CM

## 2021-11-26 NOTE — Therapy (Signed)
[facility] @ [place], [place], [address] Phone: [phone]   Fax:  [phone]  Physical Therapy Treatment  Patient Details  Name: Christine Lozano MRN: [id number] Date of Birth: 18-Dec-1955 Referring Provider (PT): Dr. Autumn Messing   Encounter Date: 11/26/2021   PT End of Session - 11/26/21 0902     Visit Number 9    Number of Visits 15    Date for PT Re-Evaluation 12/16/21    PT Start Time 0806    PT Stop Time 0901    PT Time Calculation (min) 55 min    Activity Tolerance Patient tolerated treatment well    Behavior During Therapy [facility] for tasks assessed/performed             Past Medical History:  Diagnosis Date   Facial twitching    Hypertension    Personal history of radiation therapy     Past Surgical History:  Procedure Laterality Date   BREAST BIOPSY Right 08/04/2021   BREAST CYST EXCISION Left 0814,4818   x2   BREAST LUMPECTOMY     BUNIONECTOMY Left 10 years ago   [place] Right 09/27/2021   Procedure: RIGHT MASTECTOMY WITH SENTINEL LYMPH NODE BIOPSY;  Surgeon: Jovita Kussmaul, MD;  Location: [facility];  Service: General;  Laterality: Right;   TOOTH EXTRACTION      There were no vitals filed for this visit.   Subjective Assessment - 11/26/21 0806     Subjective My cording is still there but it is improving. I feel like my ROM is getting better.    Pertinent History Patient was diagnosed on 07/29/2021 with right grade II invasive ductal carcinoma breast cancer. She underwent a right mastectomy and sentinel node biopsy (2 negative nodes) on 09/27/2021 It is ER/PR positive and HER2 negative. She has a history of right breast cancer in 2001 with a lumpectomy, sentinel node biopsy, and radiation.    Patient Stated Goals Get my arm better    Currently in Pain? No/denies    Pain Score 0-No pain                               [facility] Adult PT Treatment/Exercise  - 11/26/21 0001       Shoulder Exercises: Pulleys   Flexion 2 minutes    ABduction 2 minutes      Shoulder Exercises: Therapy Ball   Flexion Both;10 reps   with stretch at end range   ABduction Right;10 reps      Manual Therapy   Myofascial Release In Supine to Rt axilla, medial upper arm and antecubital fossa where multiple cords palpable    Manual Lymphatic Drainage (MLD) Briefly In Supine: short neck, Rt inguinal nodes, Rt axillo-inguinal anastomosis, then Rt upper arm retracing all steps, then finished retracing steps    Passive ROM In Supine to Rt shoulder into flexion and abduction                          PT Long Term Goals - 11/18/21 1023       PT LONG TERM GOAL #1   Title Patient will demonstrate she has regained full shoulder ROM and function post op compared to baselines.    Baseline great progress but not full    Time 4    Period Weeks    Status  On-going    Target Date 12/16/21      PT LONG TERM GOAL #2   Baseline 81 post op and 145 pre op, 136 11/18/2021    Time 4    Status On-going    Target Date 12/16/21      PT LONG TERM GOAL #3   Title Patient will increase right shoulder active abduction to >/= 140 degrees for increased ease tolerating daily tasks.    Baseline 77 post op; 155 pre-op,  137    Time 4    Period Weeks    Status On-going    Target Date 12/16/21      PT LONG TERM GOAL #4   Title Patient will verbalize good understanding of lymphedema risk reduction practices.    Time 4    Period Weeks    Status On-going    Target Date 12/16/21      PT LONG TERM GOAL #5   Title Patient will imporve her DASH score to be </= 20 for improved overall UE function.    Baseline 54.55 post op; 0 pre-op, 22.73 today 12/29    Time 4    Period Weeks    Status On-going    Target Date 12/16/21                   Plan - 11/26/21 0902     Clinical Impression Statement Pt reports her cording is improving. She still demonstrates  decreased R shoulder ROM. Continued with AAROM exercises today which pt was able to demonstrate full ROM with this. Then focused on myofascial release to cording with cording palpable from axilla to just distal to antecubital fossa. While no releases were noted she did have less tension on cords by end of session. Several very thick cords still palpable in axilla.    PT Frequency 2x / week    PT Duration 4 weeks    PT Treatment/Interventions ADLs/Self Care Home Management;Therapeutic exercise;Patient/family education;Passive range of motion;Manual techniques;Manual lymph drainage;Scar mobilization;Therapeutic activities    PT Next Visit Plan Trunk rotation stretch/lat stretch,continue pulleys and ball roll up wall; cont PROM, STM, and myofascial techniques, MLD for Rt UE and axillary cording    PT Home Exercise Plan Post op HEP; end ROM stretching in doorway, single arm chest/NTS on wall    Consulted and Agree with Plan of Care Patient             Patient will benefit from skilled therapeutic intervention in order to improve the following deficits and impairments:  Postural dysfunction, Decreased range of motion, Decreased knowledge of precautions, Impaired UE functional use, Pain, Decreased scar mobility, Increased fascial restricitons  Visit Diagnosis: Aftercare following surgery for neoplasm  Stiffness of right shoulder, not elsewhere classified  Abnormal posture  Malignant neoplasm of lower-inner quadrant of right breast of female, estrogen receptor positive ([place])     Problem List Patient Active Problem List   Diagnosis Date Noted   Cancer of right female breast ([place]) 09/27/2021   Malignant neoplasm of lower-inner quadrant of right female breast ([place]) 08/10/2021   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

## 2021-11-27 ENCOUNTER — Other Ambulatory Visit: Payer: Self-pay | Admitting: Hematology and Oncology

## 2021-11-30 ENCOUNTER — Encounter: Payer: Self-pay | Admitting: Physical Therapy

## 2021-11-30 ENCOUNTER — Ambulatory Visit: Payer: Medicare Other | Admitting: Physical Therapy

## 2021-11-30 ENCOUNTER — Other Ambulatory Visit: Payer: Self-pay

## 2021-11-30 DIAGNOSIS — R293 Abnormal posture: Secondary | ICD-10-CM

## 2021-11-30 DIAGNOSIS — Z483 Aftercare following surgery for neoplasm: Secondary | ICD-10-CM

## 2021-11-30 DIAGNOSIS — Z170 Estrogen receptor positive status [ER+]: Secondary | ICD-10-CM

## 2021-11-30 DIAGNOSIS — C50311 Malignant neoplasm of lower-inner quadrant of right female breast: Secondary | ICD-10-CM

## 2021-11-30 DIAGNOSIS — M25611 Stiffness of right shoulder, not elsewhere classified: Secondary | ICD-10-CM

## 2021-11-30 NOTE — Therapy (Signed)
[facility] @ [place], [place], [address] Phone: [phone]   Fax:  [phone]  Physical Therapy Treatment  Patient Details  Name: Christine Lozano MRN: [id number] Date of Birth: 1956-11-20 Referring Provider (PT): Dr. Autumn Messing   Encounter Date: 11/30/2021   PT End of Session - 11/30/21 0859     Visit Number 10    Number of Visits 15    Date for PT Re-Evaluation 12/16/21    PT Start Time 0806    PT Stop Time 0855    PT Time Calculation (min) 49 min    Activity Tolerance Patient tolerated treatment well    Behavior During Therapy [facility] for tasks assessed/performed             Past Medical History:  Diagnosis Date   Facial twitching    Hypertension    Personal history of radiation therapy     Past Surgical History:  Procedure Laterality Date   BREAST BIOPSY Right 08/04/2021   BREAST CYST EXCISION Left 9470,9628   x2   BREAST LUMPECTOMY     BUNIONECTOMY Left 10 years ago   [place] Right 09/27/2021   Procedure: RIGHT MASTECTOMY WITH SENTINEL LYMPH NODE BIOPSY;  Surgeon: Jovita Kussmaul, MD;  Location: [facility];  Service: General;  Laterality: Right;   TOOTH EXTRACTION      There were no vitals filed for this visit.   Subjective Assessment - 11/30/21 0813     Subjective Pt reports her arm did well after last session and the cording did not tighten back up as bad.    Pertinent History Patient was diagnosed on 07/29/2021 with right grade II invasive ductal carcinoma breast cancer. She underwent a right mastectomy and sentinel node biopsy (2 negative nodes) on 09/27/2021 It is ER/PR positive and HER2 negative. She has a history of right breast cancer in 2001 with a lumpectomy, sentinel node biopsy, and radiation.    Patient Stated Goals Get my arm better    Currently in Pain? No/denies    Pain Score 0-No pain                               [facility] Adult PT  Treatment/Exercise - 11/30/21 0001       Manual Therapy   Myofascial Release In Supine to Rt axilla, medial upper arm and antecubital fossa where multiple cords palpable    Manual Lymphatic Drainage (MLD) Briefly In Supine: Rt inguinal nodes, Rt axillo-inguinal anastomosis, then Rt upper arm retracing all steps, then finished retracing steps    Passive ROM In Supine to Rt shoulder into flexion and abduction                          PT Long Term Goals - 11/18/21 1023       PT LONG TERM GOAL #1   Title Patient will demonstrate she has regained full shoulder ROM and function post op compared to baselines.    Baseline great progress but not full    Time 4    Period Weeks    Status On-going    Target Date 12/16/21      PT LONG TERM GOAL #2   Baseline 81 post op and 145 pre op, 136 11/18/2021    Time 4    Status On-going    Target Date 12/16/21  PT LONG TERM GOAL #3   Title Patient will increase right shoulder active abduction to >/= 140 degrees for increased ease tolerating daily tasks.    Baseline 77 post op; 155 pre-op,  137    Time 4    Period Weeks    Status On-going    Target Date 12/16/21      PT LONG TERM GOAL #4   Title Patient will verbalize good understanding of lymphedema risk reduction practices.    Time 4    Period Weeks    Status On-going    Target Date 12/16/21      PT LONG TERM GOAL #5   Title Patient will imporve her DASH score to be </= 20 for improved overall UE function.    Baseline 54.55 post op; 0 pre-op, 22.73 today 12/29    Time 4    Period Weeks    Status On-going    Target Date 12/16/21                   Plan - 11/30/21 0814     Clinical Impression Statement Pt reports that her cording improved after last session and did not tighten back up to the point that it had been. Continued with myofascial releasel today to cording in axilla extending down upper arm. Thick cord in axilla feels like a bundle of several  smaller cords. Though no releases noted today cording did improve with myofascial release and was less taut.    PT Frequency 2x / week    PT Duration 4 weeks    PT Treatment/Interventions ADLs/Self Care Home Management;Therapeutic exercise;Patient/family education;Passive range of motion;Manual techniques;Manual lymph drainage;Scar mobilization;Therapeutic activities    PT Next Visit Plan Trunk rotation stretch/lat stretch,continue pulleys and ball roll up wall; cont PROM, STM, and myofascial techniques, MLD for Rt UE and axillary cording    PT Home Exercise Plan Post op HEP; end ROM stretching in doorway, single arm chest/NTS on wall    Consulted and Agree with Plan of Care Patient             Patient will benefit from skilled therapeutic intervention in order to improve the following deficits and impairments:  Postural dysfunction, Decreased range of motion, Decreased knowledge of precautions, Impaired UE functional use, Pain, Decreased scar mobility, Increased fascial restricitons  Visit Diagnosis: Aftercare following surgery for neoplasm  Stiffness of right shoulder, not elsewhere classified  Abnormal posture  Malignant neoplasm of lower-inner quadrant of right breast of female, estrogen receptor positive ([place])     Problem List Patient Active Problem List   Diagnosis Date Noted   Cancer of right female breast ([place]) 09/27/2021   Malignant neoplasm of lower-inner quadrant of right female breast ([place]) 08/10/2021   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

## 2021-12-02 ENCOUNTER — Telehealth: Payer: Self-pay | Admitting: *Deleted

## 2021-12-02 ENCOUNTER — Encounter: Payer: Self-pay | Admitting: Rehabilitation

## 2021-12-02 ENCOUNTER — Ambulatory Visit: Payer: Medicare Other | Admitting: Rehabilitation

## 2021-12-02 ENCOUNTER — Other Ambulatory Visit: Payer: Self-pay

## 2021-12-02 DIAGNOSIS — Z483 Aftercare following surgery for neoplasm: Secondary | ICD-10-CM | POA: Diagnosis not present

## 2021-12-02 DIAGNOSIS — R293 Abnormal posture: Secondary | ICD-10-CM

## 2021-12-02 DIAGNOSIS — C50311 Malignant neoplasm of lower-inner quadrant of right female breast: Secondary | ICD-10-CM

## 2021-12-02 DIAGNOSIS — M25611 Stiffness of right shoulder, not elsewhere classified: Secondary | ICD-10-CM

## 2021-12-02 DIAGNOSIS — Z170 Estrogen receptor positive status [ER+]: Secondary | ICD-10-CM

## 2021-12-02 NOTE — Therapy (Signed)
[facility] @ [place], [place], [address] Phone: [phone]   Fax:  [phone]  Physical Therapy Treatment  Patient Details  Name: Christine Lozano MRN: [id number] Date of Birth: 10/28/1956 Referring Provider (PT): Dr. Autumn Messing   Encounter Date: 12/02/2021   PT End of Session - 12/02/21 0843     Visit Number 11    Number of Visits 15    Date for PT Re-Evaluation 12/16/21    PT Start Time 0802    PT Stop Time 0845    PT Time Calculation (min) 43 min    Activity Tolerance Patient tolerated treatment well    Behavior During Therapy [facility] for tasks assessed/performed             Past Medical History:  Diagnosis Date   Facial twitching    Hypertension    Personal history of radiation therapy     Past Surgical History:  Procedure Laterality Date   BREAST BIOPSY Right 08/04/2021   BREAST CYST EXCISION Left 4944,9675   x2   BREAST LUMPECTOMY     BUNIONECTOMY Left 10 years ago   [place] Right 09/27/2021   Procedure: RIGHT MASTECTOMY WITH SENTINEL LYMPH NODE BIOPSY;  Surgeon: Jovita Kussmaul, MD;  Location: [facility];  Service: General;  Laterality: Right;   TOOTH EXTRACTION      There were no vitals filed for this visit.   Subjective Assessment - 12/02/21 0806     Subjective I am feeling around 90% these days.  I still feel the cords pull when I reach    Pertinent History Patient was diagnosed on 07/29/2021 with right grade II invasive ductal carcinoma breast cancer. She underwent a right mastectomy and sentinel node biopsy (2 negative nodes) on 09/27/2021 It is ER/PR positive and HER2 negative. She has a history of right breast cancer in 2001 with a lumpectomy, sentinel node biopsy, and radiation.    Currently in Pain? No/denies                               [facility] Adult PT Treatment/Exercise - 12/02/21 0001       Shoulder Exercises: Supine   Other Supine  Exercises AROM supine flexion, scaption, horizontal abd, D2 x 5 each      Shoulder Exercises: Pulleys   Flexion 2 minutes    ABduction 2 minutes      Shoulder Exercises: Therapy Ball   Flexion Both;10 reps      Manual Therapy   Myofascial Release In Supine to Rt axilla, medial upper arm and antecubital fossa where multiple cords palpable    Manual Lymphatic Drainage (MLD) Briefly In Supine: Rt inguinal nodes, Rt axillo-inguinal anastomosis, then Rt upper arm retracing all steps, then finished retracing steps    Passive ROM In Supine to Rt shoulder into flexion and abduction                          PT Long Term Goals - 11/18/21 1023       PT LONG TERM GOAL #1   Title Patient will demonstrate she has regained full shoulder ROM and function post op compared to baselines.    Baseline great progress but not full    Time 4    Period Weeks    Status On-going    Target Date 12/16/21  PT LONG TERM GOAL #2   Baseline 81 post op and 145 pre op, 136 11/18/2021    Time 4    Status On-going    Target Date 12/16/21      PT LONG TERM GOAL #3   Title Patient will increase right shoulder active abduction to >/= 140 degrees for increased ease tolerating daily tasks.    Baseline 77 post op; 155 pre-op,  137    Time 4    Period Weeks    Status On-going    Target Date 12/16/21      PT LONG TERM GOAL #4   Title Patient will verbalize good understanding of lymphedema risk reduction practices.    Time 4    Period Weeks    Status On-going    Target Date 12/16/21      PT LONG TERM GOAL #5   Title Patient will imporve her DASH score to be </= 20 for improved overall UE function.    Baseline 54.55 post op; 0 pre-op, 22.73 today 12/29    Time 4    Period Weeks    Status On-going    Target Date 12/16/21                   Plan - 12/02/21 0844     Clinical Impression Statement Cording is much softer compared to last visit with this PT now less in one large  cord and more like 3-4 smaller cords.    PT Frequency 2x / week    PT Duration 4 weeks    PT Treatment/Interventions ADLs/Self Care Home Management;Therapeutic exercise;Patient/family education;Passive range of motion;Manual techniques;Manual lymph drainage;Scar mobilization;Therapeutic activities    PT Next Visit Plan cont PROM, STM, and myofascial techniques, MLD for Rt UE and axillary cording    Consulted and Agree with Plan of Care Patient             Patient will benefit from skilled therapeutic intervention in order to improve the following deficits and impairments:     Visit Diagnosis: Aftercare following surgery for neoplasm  Stiffness of right shoulder, not elsewhere classified  Abnormal posture  Malignant neoplasm of lower-inner quadrant of right breast of female, estrogen receptor positive ([place])     Problem List Patient Active Problem List   Diagnosis Date Noted   Cancer of right female breast ([place]) 09/27/2021   Malignant neoplasm of lower-inner quadrant of right female breast ([place]) 08/10/2021   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

## 2021-12-03 ENCOUNTER — Telehealth: Payer: Self-pay | Admitting: *Deleted

## 2021-12-06 ENCOUNTER — Telehealth: Payer: Self-pay | Admitting: *Deleted

## 2021-12-06 NOTE — Telephone Encounter (Signed)
Spoke to pt regarding scar tissue at site of mastectomy. Discussed scar massage BID.  Received verbal understanding. Denies further needs or questions at this time.

## 2021-12-07 ENCOUNTER — Encounter: Payer: Self-pay | Admitting: Physical Therapy

## 2021-12-07 ENCOUNTER — Other Ambulatory Visit: Payer: Self-pay

## 2021-12-07 ENCOUNTER — Ambulatory Visit: Payer: Medicare Other | Admitting: Physical Therapy

## 2021-12-07 DIAGNOSIS — Z483 Aftercare following surgery for neoplasm: Secondary | ICD-10-CM | POA: Diagnosis not present

## 2021-12-07 DIAGNOSIS — M25551 Pain in right hip: Secondary | ICD-10-CM

## 2021-12-07 DIAGNOSIS — R293 Abnormal posture: Secondary | ICD-10-CM

## 2021-12-07 DIAGNOSIS — M25552 Pain in left hip: Secondary | ICD-10-CM

## 2021-12-07 DIAGNOSIS — M25611 Stiffness of right shoulder, not elsewhere classified: Secondary | ICD-10-CM

## 2021-12-07 NOTE — Therapy (Signed)
[facility] @ [place] [address] [place], [place], [address] Phone: [phone]   Fax:  [phone]  Physical Therapy Treatment  Patient Details  Name: Christine Lozano MRN: [id number] Date of Birth: 10-31-1956 Referring Provider (PT): Dr. Autumn Messing   Encounter Date: 12/07/2021   PT End of Session - 12/07/21 1003     Visit Number 12    Number of Visits 20    Date for PT Re-Evaluation 01/04/22    PT Start Time 0911   pt arrived late   PT Stop Time 1000    PT Time Calculation (min) 49 min    Activity Tolerance Patient tolerated treatment well    Behavior During Therapy [facility] for tasks assessed/performed             Past Medical History:  Diagnosis Date   Facial twitching    Hypertension    Personal history of radiation therapy     Past Surgical History:  Procedure Laterality Date   BREAST BIOPSY Right 08/04/2021   BREAST CYST EXCISION Left [id number]   x2   BREAST LUMPECTOMY     BUNIONECTOMY Left 10 years ago   [place] Right 09/27/2021   Procedure: RIGHT MASTECTOMY WITH SENTINEL LYMPH NODE BIOPSY;  Surgeon: Jovita Kussmaul, MD;  Location: [facility];  Service: General;  Laterality: Right;   TOOTH EXTRACTION      There were no vitals filed for this visit.   Subjective Assessment - 12/07/21 0911     Subjective The cording is softer. I have a pain in my hip today that comes and goes for over 20 years.    Pertinent History Patient was diagnosed on 07/29/2021 with right grade II invasive ductal carcinoma breast cancer. She underwent a right mastectomy and sentinel node biopsy (2 negative nodes) on 09/27/2021 It is ER/PR positive and HER2 negative. She has a history of right breast cancer in 2001 with a lumpectomy, sentinel node biopsy, and radiation.    Patient Stated Goals Get my arm better    Currently in Pain? Yes    Pain Score 8     Pain Location Hip    Pain Orientation Left    Pain Descriptors /  Indicators Sharp    Pain Type Chronic pain    Pain Onset More than a month ago    Pain Frequency Intermittent    Aggravating Factors  moving    Pain Relieving Factors resting usually    Effect of Pain on Daily Activities has to stop the daily activites                               [facility] Adult PT Treatment/Exercise - 12/07/21 0001       Manual Therapy   Myofascial Release In Supine to Rt axilla, medial upper arm and antecubital fossa where multiple cords palpable    Manual Lymphatic Drainage (MLD) Briefly In Supine: Rt inguinal nodes, Rt axillo-inguinal anastomosis, then Rt upper arm retracing all steps, then finished retracing steps    Passive ROM In Supine to Rt shoulder into flexion and abduction                          PT Long Term Goals - 12/07/21 1004       PT LONG TERM GOAL #1   Title Patient will demonstrate she has regained  full shoulder ROM and function post op compared to baselines.    Baseline great progress but not full; 12/07/21- still tightness at end range on R side    Time 4    Period Weeks    Status On-going    Target Date 12/16/21      PT LONG TERM GOAL #2   Title Patient will improve right shoulder active flexion to >= 140 degrees for improved ability to reach.    Baseline 81 post op and 145 pre op, 136 11/18/2021    Time 4    Period Weeks    Status On-going      PT LONG TERM GOAL #3   Title Patient will increase right shoulder active abduction to >/= 140 degrees for increased ease tolerating daily tasks.    Baseline 77 post op; 155 pre-op,  137    Time 4    Period Weeks    Status On-going      PT LONG TERM GOAL #4   Title Patient will verbalize good understanding of lymphedema risk reduction practices.    Time 4    Period Weeks    Status On-going      PT LONG TERM GOAL #5   Title Patient will imporve her DASH score to be </= 20 for improved overall UE function.    Baseline 54.55 post op; 0 pre-op, 22.73  today 12/29    Time 4    Period Weeks    Status On-going      Additional Long Term Goals   Additional Long Term Goals Yes      PT LONG TERM GOAL #6   Title Pt will no longer demonstrate end range tightness on R side secondary to cording    Time 4    Period Weeks    Status New    Target Date 01/04/22      PT LONG TERM GOAL #7   Title Pt will report a 75% improvement in bilateral hip pain and have knowledge of stretching to help prevent hip pain    Time 4    Period Weeks    Status New    Target Date 01/04/22                   Plan - 12/07/21 0955     Clinical Impression Statement Cording continues to improve. There is still a very thick cords and several smaller cords but it does improve following myofascial release. Pt was having hip pain today. She reports she has been dealing with this intermittently for over 20 years. She has increased pain when sitting in a hard chair or sitting for long periods. She pinpoints the pain to the area of her piriformis. Added hip pain to patients plan of care today and added a goal. Pt would benefit from stretching and soft tissue mobilization to the piriformis to help decrease hip pain and from continued manual therapy to decrease cording.    PT Frequency 2x / week    PT Duration 4 weeks    PT Treatment/Interventions ADLs/Self Care Home Management;Therapeutic exercise;Patient/family education;Passive range of motion;Manual techniques;Manual lymph drainage;Scar mobilization;Therapeutic activities    PT Next Visit Plan measure shoulder ROM and update those goals, POC updated to included hip pain so give piriformis stretches and do STM to bilateral piriformis, cont PROM, STM, and myofascial techniques, MLD for Rt UE and axillary cording    PT Home Exercise Plan Post op HEP; end ROM stretching in doorway, single  arm chest/NTS on wall    Consulted and Agree with Plan of Care Patient             Patient will benefit from skilled therapeutic  intervention in order to improve the following deficits and impairments:  Postural dysfunction, Decreased range of motion, Decreased knowledge of precautions, Impaired UE functional use, Pain, Decreased scar mobility, Increased fascial restricitons  Visit Diagnosis: Aftercare following surgery for neoplasm  Stiffness of right shoulder, not elsewhere classified  Pain in right hip  Pain in left hip  Abnormal posture     Problem List Patient Active Problem List   Diagnosis Date Noted   Cancer of right female breast ([place]) 09/27/2021   Malignant neoplasm of lower-inner quadrant of right female breast ([place]) 08/10/2021   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

## 2021-12-09 ENCOUNTER — Other Ambulatory Visit: Payer: Self-pay

## 2021-12-09 ENCOUNTER — Ambulatory Visit: Payer: Medicare Other | Admitting: Rehabilitation

## 2021-12-09 ENCOUNTER — Encounter: Payer: Self-pay | Admitting: Rehabilitation

## 2021-12-09 DIAGNOSIS — Z483 Aftercare following surgery for neoplasm: Secondary | ICD-10-CM

## 2021-12-09 DIAGNOSIS — M25551 Pain in right hip: Secondary | ICD-10-CM

## 2021-12-09 DIAGNOSIS — M25552 Pain in left hip: Secondary | ICD-10-CM

## 2021-12-09 DIAGNOSIS — R293 Abnormal posture: Secondary | ICD-10-CM

## 2021-12-09 DIAGNOSIS — C50311 Malignant neoplasm of lower-inner quadrant of right female breast: Secondary | ICD-10-CM

## 2021-12-09 DIAGNOSIS — M25611 Stiffness of right shoulder, not elsewhere classified: Secondary | ICD-10-CM

## 2021-12-09 NOTE — Patient Instructions (Signed)
Emailed to patient medbridge: Seated and supine figure 4, KTOS

## 2021-12-09 NOTE — Therapy (Signed)
[facility] @ [place], [place], [address] Phone: [phone]   Fax:  [phone]  Physical Therapy Treatment  Patient Details  Name: Christine Lozano MRN: [id number] Date of Birth: 28-Apr-1956 Referring Provider (PT): Dr. Autumn Messing   Encounter Date: 12/09/2021   PT End of Session - 12/09/21 0856     Visit Number 13    Number of Visits 20    Date for PT Re-Evaluation 01/04/22    PT Start Time 0806    PT Stop Time 0855    PT Time Calculation (min) 49 min    Activity Tolerance Patient tolerated treatment well    Behavior During Therapy [facility] for tasks assessed/performed             Past Medical History:  Diagnosis Date   Facial twitching    Hypertension    Personal history of radiation therapy     Past Surgical History:  Procedure Laterality Date   BREAST BIOPSY Right 08/04/2021   BREAST CYST EXCISION Left [id number]   x2   BREAST LUMPECTOMY     BUNIONECTOMY Left 10 years ago   [place] Right 09/27/2021   Procedure: RIGHT MASTECTOMY WITH SENTINEL LYMPH NODE BIOPSY;  Surgeon: Jovita Kussmaul, MD;  Location: [facility];  Service: General;  Laterality: Right;   TOOTH EXTRACTION      There were no vitals filed for this visit.   Subjective Assessment - 12/09/21 0809     Subjective The pain is about the same in the hip.  Arm is better    Pertinent History Patient was diagnosed on 07/29/2021 with right grade II invasive ductal carcinoma breast cancer. She underwent a right mastectomy and sentinel node biopsy (2 negative nodes) on 09/27/2021 It is ER/PR positive and HER2 negative. She has a history of right breast cancer in 2001 with a lumpectomy, sentinel node biopsy, and radiation.    Currently in Pain? Yes    Pain Score 5     Pain Location Hip    Pain Orientation Left    Pain Descriptors / Indicators Sharp    Pain Type Chronic pain    Pain Onset More than a month ago    Pain Frequency  Intermittent                               [facility] Adult PT Treatment/Exercise - 12/09/21 0001       Exercises   Exercises Other Exercises    Other Exercises  education on piriformis figure 4 stretches in supine and seated and KTOS with email handout of each and performance x 1 x 20" also discussed ice/heat/self tennis ball on wall massage      Shoulder Exercises: Pulleys   Flexion 2 minutes    ABduction 2 minutes      Shoulder Exercises: Therapy Ball   Flexion Both;10 reps    ABduction Right;10 reps    ABduction Limitations with review of performance      Manual Therapy   Myofascial Release In Supine to Rt axilla, medial upper arm and antecubital fossa where multiple cords palpable    Passive ROM In Supine to Rt shoulder into flexion and abduction                          PT Long Term Goals - 12/07/21 1004  PT LONG TERM GOAL #1   Title Patient will demonstrate she has regained full shoulder ROM and function post op compared to baselines.    Baseline great progress but not full; 12/07/21- still tightness at end range on R side    Time 4    Period Weeks    Status On-going    Target Date 12/16/21      PT LONG TERM GOAL #2   Title Patient will improve right shoulder active flexion to >= 140 degrees for improved ability to reach.    Baseline 81 post op and 145 pre op, 136 11/18/2021    Time 4    Period Weeks    Status On-going      PT LONG TERM GOAL #3   Title Patient will increase right shoulder active abduction to >/= 140 degrees for increased ease tolerating daily tasks.    Baseline 77 post op; 155 pre-op,  137    Time 4    Period Weeks    Status On-going      PT LONG TERM GOAL #4   Title Patient will verbalize good understanding of lymphedema risk reduction practices.    Time 4    Period Weeks    Status On-going      PT LONG TERM GOAL #5   Title Patient will imporve her DASH score to be </= 20 for improved overall UE  function.    Baseline 54.55 post op; 0 pre-op, 22.73 today 12/29    Time 4    Period Weeks    Status On-going      Additional Long Term Goals   Additional Long Term Goals Yes      PT LONG TERM GOAL #6   Title Pt will no longer demonstrate end range tightness on R side secondary to cording    Time 4    Period Weeks    Status New    Target Date 01/04/22      PT LONG TERM GOAL #7   Title Pt will report a 75% improvement in bilateral hip pain and have knowledge of stretching to help prevent hip pain    Time 4    Period Weeks    Status New    Target Date 01/04/22                   Plan - 12/09/21 0856     Clinical Impression Statement Discussed hip stretches with handout today.  Also discussed ice/heat/self massage and starting some hip strengthening with less pain.  Continued with cording release and AROM/PROM    PT Frequency 2x / week    PT Duration 4 weeks    PT Treatment/Interventions ADLs/Self Care Home Management;Therapeutic exercise;Patient/family education;Passive range of motion;Manual techniques;Manual lymph drainage;Scar mobilization;Therapeutic activities    PT Next Visit Plan measure shoulder ROM and update those goals, cont PROM, STM, and myofascial techniques, MLD for Rt UE and axillary cording,  review piriformis stretches and add TE as able with less pain    Consulted and Agree with Plan of Care Patient             Patient will benefit from skilled therapeutic intervention in order to improve the following deficits and impairments:     Visit Diagnosis: Aftercare following surgery for neoplasm  Stiffness of right shoulder, not elsewhere classified  Pain in right hip  Pain in left hip  Abnormal posture  Malignant neoplasm of lower-inner quadrant of right breast of female, estrogen receptor  positive [facility])     Problem List Patient Active Problem List   Diagnosis Date Noted   Cancer of right female breast ([place]) 09/27/2021   Malignant  neoplasm of lower-inner quadrant of right female breast ([place]) 08/10/2021   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

## 2021-12-13 ENCOUNTER — Other Ambulatory Visit: Payer: Self-pay | Admitting: *Deleted

## 2021-12-13 ENCOUNTER — Inpatient Hospital Stay: Payer: Medicare Other | Attending: Hematology and Oncology | Admitting: *Deleted

## 2021-12-13 ENCOUNTER — Other Ambulatory Visit: Payer: Self-pay

## 2021-12-13 ENCOUNTER — Encounter: Payer: Self-pay | Admitting: *Deleted

## 2021-12-13 DIAGNOSIS — C50311 Malignant neoplasm of lower-inner quadrant of right female breast: Secondary | ICD-10-CM

## 2021-12-13 DIAGNOSIS — Z170 Estrogen receptor positive status [ER+]: Secondary | ICD-10-CM

## 2021-12-13 DIAGNOSIS — Z79811 Long term (current) use of aromatase inhibitors: Secondary | ICD-10-CM

## 2021-12-13 NOTE — Progress Notes (Signed)
°  2 Identifiers used for verification purposes only. No vital signs taken this being a telephone visit. Medications and allergies were reviewed. SCP reviewed and completed. SDOH assessed and completed. Pt denies pain. Pt is doing well on anastrozole.She started anastrozole in November and has not noticed anything different except a few hot flashes but able to deal with them. She has no complaints of joint issues and no changes vaginally.  She visited with her PCP late 2022. Pt will see her GYN in March, 2023 for annual care. She continues to have PT at [facility] which she says is helping her mobility issues to the right extremity. Pt is scheduled for colonoscopy in September this year. Last colonoscopy was 06/21/2017. Pt last saw Dr. Marlou Starks in November, 2022. Next mammo due Sept,2023 and bone density due as well. I have put orders in for both. Pt does not take flu vaccine.. She has started exercising by walking every other day for 30 minutes.

## 2021-12-14 ENCOUNTER — Encounter: Payer: Self-pay | Admitting: Physical Therapy

## 2021-12-14 ENCOUNTER — Ambulatory Visit: Payer: Medicare Other | Admitting: Physical Therapy

## 2021-12-14 ENCOUNTER — Other Ambulatory Visit: Payer: Self-pay

## 2021-12-14 DIAGNOSIS — R293 Abnormal posture: Secondary | ICD-10-CM

## 2021-12-14 DIAGNOSIS — Z170 Estrogen receptor positive status [ER+]: Secondary | ICD-10-CM

## 2021-12-14 DIAGNOSIS — Z483 Aftercare following surgery for neoplasm: Secondary | ICD-10-CM

## 2021-12-14 DIAGNOSIS — M25552 Pain in left hip: Secondary | ICD-10-CM

## 2021-12-14 DIAGNOSIS — M25611 Stiffness of right shoulder, not elsewhere classified: Secondary | ICD-10-CM

## 2021-12-14 DIAGNOSIS — M25551 Pain in right hip: Secondary | ICD-10-CM

## 2021-12-14 NOTE — Therapy (Signed)
[name], [place], [address] Phone: [phone]   Fax:  [phone]  Physical Therapy Treatment  Patient Details  Name: Christine Lozano MRN: [id number] Date of Birth: [birth year] Referring Provider (PT): Dr. Autumn Messing   Encounter Date: 12/14/2021   PT End of Session - 12/14/21 0856     Visit Number 14    Number of Visits 20    Date for PT Re-Evaluation 01/04/22    PT Start Time 0808    PT Stop Time 0855    PT Time Calculation (min) 47 min    Activity Tolerance Patient tolerated treatment well    Behavior During Therapy [facility] for tasks assessed/performed             Past Medical History:  Diagnosis Date   Facial twitching    Hypertension    Personal history of radiation therapy     Past Surgical History:  Procedure Laterality Date   BREAST BIOPSY Right 08/04/2021   BREAST CYST EXCISION Left [id number]   x2   BREAST LUMPECTOMY     BUNIONECTOMY Left 10 years ago   [place] Right 09/27/2021   Procedure: RIGHT MASTECTOMY WITH SENTINEL LYMPH NODE BIOPSY;  Surgeon: Jovita Kussmaul, MD;  Location: [facility];  Service: General;  Laterality: Right;   TOOTH EXTRACTION      There were no vitals filed for this visit.   Subjective Assessment - 12/14/21 0810     Subjective The stretches seem to be helping. I am not having any hip pain. I have not had hip pain in 2 days.    Pertinent History Patient was diagnosed on 07/29/2021 with right grade II invasive ductal carcinoma breast cancer. She underwent a right mastectomy and sentinel node biopsy (2 negative nodes) on 09/27/2021 It is ER/PR positive and HER2 negative. She has a history of right breast cancer in 2001 with a lumpectomy, sentinel node biopsy, and radiation.    Patient Stated Goals Get my arm better    Currently in Pain? No/denies    Pain Score 0-No pain                       [name] - 12/14/21 0001      Open a tight or new jar No difficulty    Do heavy household chores (wash walls, wash floors) No difficulty    Carry a shopping bag or briefcase No difficulty    Wash your back No difficulty    Use a knife to cut food No difficulty    Recreational activities in which you take some force or impact through your arm, shoulder, or hand (golf, hammering, tennis) No difficulty    During the past week, to what extent has your arm, shoulder or hand problem interfered with your normal social activities with family, friends, neighbors, or groups? Not at all    During the past week, to what extent has your arm, shoulder or hand problem limited your work or other regular daily activities Not at all    Arm, shoulder, or hand pain. None    Tingling (pins and needles) in your arm, shoulder, or hand None    Difficulty Sleeping No difficulty    DASH Score 0 %                    [facility] Adult PT Treatment/Exercise - 12/14/21 0001  Exercises   Other Exercises  instructed pt in doorway stretch and corner stretch for pecs      Manual Therapy   Myofascial Release In Supine to Rt axilla, medial upper arm and antecubital fossa where multiple cords palpable, and gently across R mastectomy scar    Passive ROM In Supine to Rt shoulder into flexion and abduction                     PT Education - 12/14/21 0902     Education Details lymphedema risk reduction practices and issued handout    Person(s) Educated Patient    Methods Explanation;Handout    Comprehension Verbalized understanding                 PT Long Term Goals - 12/14/21 0811       PT LONG TERM GOAL #1   Title Patient will demonstrate she has regained full shoulder ROM and function post op compared to baselines.    Baseline great progress but not full; 12/07/21- still tightness at end range on R side; 12/14/21- pt has returned to baseline    Time 4    Period Weeks    Status Achieved      PT LONG TERM GOAL #2    Title Patient will improve right shoulder active flexion to >= 140 degrees for improved ability to reach.    Baseline 81 post op and 145 pre op, 136 11/18/2021; 12/14/21- 165    Time 4    Period Weeks    Status Achieved      PT LONG TERM GOAL #3   Title Patient will increase right shoulder active abduction to >/= 140 degrees for increased ease tolerating daily tasks.    Baseline 77 post op; 155 pre-op,  137; 12/14/21- 155    Time 4    Period Weeks    Status Achieved      PT LONG TERM GOAL #4   Title Patient will verbalize good understanding of lymphedema risk reduction practices.    Baseline 12/14/21- pt educated today    Time 4    Period Weeks    Status On-going      PT LONG TERM GOAL #5   Title Patient will imporve her DASH score to be </= 20 for improved overall UE function.    Baseline 54.55 post op; 0 pre-op, 22.73 today 12/29; 12/14/21- 0    Time 4    Period Weeks    Status Achieved      PT LONG TERM GOAL #6   Title Pt will no longer demonstrate end range tightness on R side secondary to cording    Baseline 12/14/21- pt still has tightness at end range    Time 4    Period Weeks    Status On-going      PT LONG TERM GOAL #7   Title Pt will report a 75% improvement in bilateral hip pain and have knowledge of stretching to help prevent hip pain    Time 4    Period Weeks    Status On-going                   Plan - 12/14/21 2800     Clinical Impression Statement Assessed pt's progress towards goals in therapy. She has met her shoulder ROM goals but continues to present with tightness at end range secondary to cording. Her R pec is also very tight so instructed pt in pec stretches  today. Also incorporated scar mobilization across R mastectomy scar. Pt would benefit from continued skilled PT services to continue to decrease cording and decrease pec tightness to allow pt to comfortably sleep in her right side.    PT Frequency 2x / week    PT Duration 4 weeks    PT  Treatment/Interventions ADLs/Self Care Home Management;Therapeutic exercise;Patient/family education;Passive range of motion;Manual techniques;Manual lymph drainage;Scar mobilization;Therapeutic activities    PT Next Visit Plan DO SOZO** cancel one in Feb and schedule from 3 months after next visit, scar mobilization, cont PROM, STM, and myofascial techniques, MLD for Rt UE and axillary cording,  review piriformis stretches and add TE as able with less pain    PT Home Exercise Plan Post op HEP; end ROM stretching in doorway, single arm chest/NTS on wall, pec stretch    Consulted and Agree with Plan of Care Patient             Patient will benefit from skilled therapeutic intervention in order to improve the following deficits and impairments:  Postural dysfunction, Decreased range of motion, Decreased knowledge of precautions, Impaired UE functional use, Pain, Decreased scar mobility, Increased fascial restricitons  Visit Diagnosis: Aftercare following surgery for neoplasm  Stiffness of right shoulder, not elsewhere classified  Pain in right hip  Pain in left hip  Abnormal posture  Malignant neoplasm of lower-inner quadrant of right breast of female, estrogen receptor positive ([place])     Problem List Patient Active Problem List   Diagnosis Date Noted   Cancer of right female breast ([place]) 09/27/2021   Malignant neoplasm of lower-inner quadrant of right female breast ([place]) 08/10/2021   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

## 2021-12-16 ENCOUNTER — Ambulatory Visit: Payer: Medicare Other

## 2021-12-16 ENCOUNTER — Other Ambulatory Visit: Payer: Self-pay | Admitting: Adult Health

## 2021-12-16 ENCOUNTER — Other Ambulatory Visit: Payer: Self-pay

## 2021-12-16 DIAGNOSIS — M25551 Pain in right hip: Secondary | ICD-10-CM

## 2021-12-16 DIAGNOSIS — C50311 Malignant neoplasm of lower-inner quadrant of right female breast: Secondary | ICD-10-CM

## 2021-12-16 DIAGNOSIS — Z170 Estrogen receptor positive status [ER+]: Secondary | ICD-10-CM

## 2021-12-16 DIAGNOSIS — Z483 Aftercare following surgery for neoplasm: Secondary | ICD-10-CM | POA: Diagnosis not present

## 2021-12-16 DIAGNOSIS — M25611 Stiffness of right shoulder, not elsewhere classified: Secondary | ICD-10-CM

## 2021-12-16 DIAGNOSIS — M25552 Pain in left hip: Secondary | ICD-10-CM

## 2021-12-16 DIAGNOSIS — R293 Abnormal posture: Secondary | ICD-10-CM

## 2021-12-16 NOTE — Therapy (Signed)
[facility] @ [place] [place], [place], [address] Phone: [phone]   Fax:  [phone]  Physical Therapy Treatment  Patient Details  Name: Christine Lozano MRN: [id number] Date of Birth: April 27, 1956 Referring Provider (PT): Dr. Autumn Messing   Encounter Date: 12/16/2021   PT End of Session - 12/16/21 0840     Visit Number 15    Number of Visits 20    Date for PT Re-Evaluation 01/04/22    PT Start Time 0802    PT Stop Time 0850    PT Time Calculation (min) 48 min    Activity Tolerance Patient tolerated treatment well    Behavior During Therapy [facility] for tasks assessed/performed             Past Medical History:  Diagnosis Date   Facial twitching    Hypertension    Personal history of radiation therapy     Past Surgical History:  Procedure Laterality Date   BREAST BIOPSY Right 08/04/2021   BREAST CYST EXCISION Left 4081,[id number]   x2   BREAST LUMPECTOMY     BUNIONECTOMY Left 10 years ago   [place] Right 09/27/2021   Procedure: RIGHT MASTECTOMY WITH SENTINEL LYMPH NODE BIOPSY;  Surgeon: Jovita Kussmaul, MD;  Location: [facility];  Service: General;  Laterality: Right;   TOOTH EXTRACTION      There were no vitals filed for this visit.   Subjective Assessment - 12/16/21 0803     Subjective The hip is still doing well. Axilla still feels tight but its getting there. Still have the cords but they are better as well.    Pertinent History Patient was diagnosed on 07/29/2021 with right grade II invasive ductal carcinoma breast cancer. She underwent a right mastectomy and sentinel node biopsy (2 negative nodes) on 09/27/2021 It is ER/PR positive and HER2 negative. She has a history of right breast cancer in 2001 with a lumpectomy, sentinel node biopsy, and radiation.    Patient Stated Goals Get my arm better    Currently in Pain? No/denies    Pain Score 0-No pain                    L-DEX  FLOWSHEETS - 12/16/21 0800       L-DEX LYMPHEDEMA SCREENING   Measurement Type Unilateral    L-DEX MEASUREMENT EXTREMITY Upper Extremity    POSITION  Standing    DOMINANT SIDE Left    At Risk Side Right    BASELINE SCORE (UNILATERAL) 6    L-DEX SCORE (UNILATERAL) 10.8    VALUE CHANGE (UNILAT) 4.8                       [facility] Adult PT Treatment/Exercise - 12/16/21 0001       Shoulder Exercises: Supine   Other Supine Exercises Supine wand flex and scaption x 5      Manual Therapy   Myofascial Release In Supine to Rt axilla, medial upper arm and antecubital fossa where multiple cords palpable, and gently across R mastectomy scar    Passive ROM In Supine to Rt shoulder into flexion and abduction                          PT Long Term Goals - 12/14/21 0811       PT LONG TERM GOAL #1   Title Patient will  demonstrate she has regained full shoulder ROM and function post op compared to baselines.    Baseline great progress but not full; 12/07/21- still tightness at end range on R side; 12/14/21- pt has returned to baseline    Time 4    Period Weeks    Status Achieved      PT LONG TERM GOAL #2   Title Patient will improve right shoulder active flexion to >= 140 degrees for improved ability to reach.    Baseline 81 post op and 145 pre op, 136 11/18/2021; 12/14/21- 165    Time 4    Period Weeks    Status Achieved      PT LONG TERM GOAL #3   Title Patient will increase right shoulder active abduction to >/= 140 degrees for increased ease tolerating daily tasks.    Baseline 77 post op; 155 pre-op,  137; 12/14/21- 155    Time 4    Period Weeks    Status Achieved      PT LONG TERM GOAL #4   Title Patient will verbalize good understanding of lymphedema risk reduction practices.    Baseline 12/14/21- pt educated today    Time 4    Period Weeks    Status On-going      PT LONG TERM GOAL #5   Title Patient will imporve her DASH score to be </= 20 for  improved overall UE function.    Baseline 54.55 post op; 0 pre-op, 22.73 today 12/29; 12/14/21- 0    Time 4    Period Weeks    Status Achieved      PT LONG TERM GOAL #6   Title Pt will no longer demonstrate end range tightness on R side secondary to cording    Baseline 12/14/21- pt still has tightness at end range    Time 4    Period Weeks    Status On-going      PT LONG TERM GOAL #7   Title Pt will report a 75% improvement in bilateral hip pain and have knowledge of stretching to help prevent hip pain    Time 4    Period Weeks    Status On-going                   Plan - 12/16/21 0841     Clinical Impression Statement Pts SOZO screen  4.8 points above baseline. Next SOZO set up for April. Continued MFR techniques and PROM. Pt continues with thick cord in axilary region but  she felt looser after treatment. She continues to get good relief from hip pain.    Stability/Clinical Decision Making Stable/Uncomplicated    Rehab Potential Excellent    PT Frequency 2x / week    PT Duration 4 weeks    PT Treatment/Interventions ADLs/Self Care Home Management;Therapeutic exercise;Patient/family education;Passive range of motion;Manual techniques;Manual lymph drainage;Scar mobilization;Therapeutic activities    PT Next Visit Plan scar mobilization, cont PROM, STM, and myofascial techniques, MLD for Rt UE and axillary cording,  review piriformis stretches and add TE as able with less pain    PT Home Exercise Plan Post op HEP; end ROM stretching in doorway, single arm chest/NTS on wall, pec stretch    Consulted and Agree with Plan of Care Patient             Patient will benefit from skilled therapeutic intervention in order to improve the following deficits and impairments:  Postural dysfunction, Decreased range of motion, Decreased knowledge of  precautions, Impaired UE functional use, Pain, Decreased scar mobility, Increased fascial restricitons  Visit Diagnosis: Aftercare  following surgery for neoplasm  Stiffness of right shoulder, not elsewhere classified  Pain in right hip  Pain in left hip  Abnormal posture  Malignant neoplasm of lower-inner quadrant of right breast of female, estrogen receptor positive ([place])     Problem List Patient Active Problem List   Diagnosis Date Noted   Cancer of right female breast ([place]) 09/27/2021   Malignant neoplasm of lower-inner quadrant of right female breast ([place]) 08/10/2021   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

## 2021-12-21 ENCOUNTER — Other Ambulatory Visit: Payer: Self-pay

## 2021-12-21 ENCOUNTER — Encounter: Payer: Self-pay | Admitting: Physical Therapy

## 2021-12-21 ENCOUNTER — Ambulatory Visit: Payer: Medicare Other | Admitting: Physical Therapy

## 2021-12-21 DIAGNOSIS — Z170 Estrogen receptor positive status [ER+]: Secondary | ICD-10-CM

## 2021-12-21 DIAGNOSIS — C50311 Malignant neoplasm of lower-inner quadrant of right female breast: Secondary | ICD-10-CM

## 2021-12-21 DIAGNOSIS — R293 Abnormal posture: Secondary | ICD-10-CM

## 2021-12-21 DIAGNOSIS — Z483 Aftercare following surgery for neoplasm: Secondary | ICD-10-CM | POA: Diagnosis not present

## 2021-12-21 DIAGNOSIS — M25551 Pain in right hip: Secondary | ICD-10-CM

## 2021-12-21 DIAGNOSIS — M25611 Stiffness of right shoulder, not elsewhere classified: Secondary | ICD-10-CM

## 2021-12-21 DIAGNOSIS — M25552 Pain in left hip: Secondary | ICD-10-CM

## 2021-12-21 NOTE — Therapy (Signed)
[facility] @ [place], [place], [address] Phone: [phone]   Fax:  [phone]  Physical Therapy Treatment  Patient Details  Name: Christine Lozano MRN: [id number] Date of Birth: 1955/12/06 Referring Provider (PT): Dr. Autumn Messing   Encounter Date: 12/21/2021   PT End of Session - 12/21/21 0950     Visit Number 16    Number of Visits 20    Date for PT Re-Evaluation 01/04/22    PT Start Time 0905    PT Stop Time 0950    PT Time Calculation (min) 45 min    Activity Tolerance Patient tolerated treatment well    Behavior During Therapy [facility] for tasks assessed/performed             Past Medical History:  Diagnosis Date   Facial twitching    Hypertension    Personal history of radiation therapy     Past Surgical History:  Procedure Laterality Date   BREAST BIOPSY Right 08/04/2021   BREAST CYST EXCISION Left [id number]   x2   BREAST LUMPECTOMY     BUNIONECTOMY Left 10 years ago   [place] Right 09/27/2021   Procedure: RIGHT MASTECTOMY WITH SENTINEL LYMPH NODE BIOPSY;  Surgeon: Jovita Kussmaul, MD;  Location: [facility];  Service: General;  Laterality: Right;   TOOTH EXTRACTION      There were no vitals filed for this visit.   Subjective Assessment - 12/21/21 0907     Subjective The hip pain is still good. I still feel a little tight from the cording.    Pertinent History Patient was diagnosed on 07/29/2021 with right grade II invasive ductal carcinoma breast cancer. She underwent a right mastectomy and sentinel node biopsy (2 negative nodes) on 09/27/2021 It is ER/PR positive and HER2 negative. She has a history of right breast cancer in 2001 with a lumpectomy, sentinel node biopsy, and radiation.    Patient Stated Goals Get my arm better    Currently in Pain? No/denies    Pain Score 0-No pain                               [facility] Adult PT Treatment/Exercise -  12/21/21 0001       Manual Therapy   Myofascial Release In Supine to Rt axilla, medial upper arm and R chest where cording is palpable    Passive ROM In Supine to Rt shoulder into flexion and abduction                          PT Long Term Goals - 12/14/21 8185       PT LONG TERM GOAL #1   Title Patient will demonstrate she has regained full shoulder ROM and function post op compared to baselines.    Baseline great progress but not full; 12/07/21- still tightness at end range on R side; 12/14/21- pt has returned to baseline    Time 4    Period Weeks    Status Achieved      PT LONG TERM GOAL #2   Title Patient will improve right shoulder active flexion to >= 140 degrees for improved ability to reach.    Baseline 81 post op and 145 pre op, 136 11/18/2021; 12/14/21- 165    Time 4    Period Weeks    Status Achieved  PT LONG TERM GOAL #3   Title Patient will increase right shoulder active abduction to >/= 140 degrees for increased ease tolerating daily tasks.    Baseline 77 post op; 155 pre-op,  137; 12/14/21- 155    Time 4    Period Weeks    Status Achieved      PT LONG TERM GOAL #4   Title Patient will verbalize good understanding of lymphedema risk reduction practices.    Baseline 12/14/21- pt educated today    Time 4    Period Weeks    Status On-going      PT LONG TERM GOAL #5   Title Patient will imporve her DASH score to be </= 20 for improved overall UE function.    Baseline 54.55 post op; 0 pre-op, 22.73 today 12/29; 12/14/21- 0    Time 4    Period Weeks    Status Achieved      PT LONG TERM GOAL #6   Title Pt will no longer demonstrate end range tightness on R side secondary to cording    Baseline 12/14/21- pt still has tightness at end range    Time 4    Period Weeks    Status On-going      PT LONG TERM GOAL #7   Title Pt will report a 75% improvement in bilateral hip pain and have knowledge of stretching to help prevent hip pain    Time 4     Period Weeks    Status On-going                   Plan - 12/21/21 0954     Clinical Impression Statement Continued with myofascial release to cording in right axilla. Educated pt that sometimes cording has to go away on it's own especially the thicker cording that persists despite therapy. The goal it that it does not limit ROM. Pt has some tightness at end range but may just need to work on this at home through stretching. Pt may be ready for discharge at next visit.    PT Frequency 2x / week    PT Duration 4 weeks    PT Treatment/Interventions ADLs/Self Care Home Management;Therapeutic exercise;Patient/family education;Passive range of motion;Manual techniques;Manual lymph drainage;Scar mobilization;Therapeutic activities    PT Next Visit Plan D/C this visit as cording is not really limiting ROM?: scar mobilization, cont PROM, STM, and myofascial techniques, MLD for Rt UE and axillary cording,  review piriformis stretches and add TE as able with less pain    PT Home Exercise Plan Post op HEP; end ROM stretching in doorway, single arm chest/NTS on wall, pec stretch    Consulted and Agree with Plan of Care Patient             Patient will benefit from skilled therapeutic intervention in order to improve the following deficits and impairments:  Postural dysfunction, Decreased range of motion, Decreased knowledge of precautions, Impaired UE functional use, Pain, Decreased scar mobility, Increased fascial restricitons  Visit Diagnosis: Aftercare following surgery for neoplasm  Stiffness of right shoulder, not elsewhere classified  Pain in right hip  Pain in left hip  Abnormal posture  Malignant neoplasm of lower-inner quadrant of right breast of female, estrogen receptor positive ([place])     Problem List Patient Active Problem List   Diagnosis Date Noted   Cancer of right female breast ([place]) 09/27/2021   Malignant neoplasm of lower-inner quadrant of right female breast  ([place]) 08/10/2021   Meige  syndrome (blepharospasm with oromandibular dystonia) 03/16/2021   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

## 2021-12-23 ENCOUNTER — Other Ambulatory Visit: Payer: Self-pay

## 2021-12-23 ENCOUNTER — Ambulatory Visit: Payer: Medicare Other | Attending: General Surgery

## 2021-12-23 DIAGNOSIS — M25552 Pain in left hip: Secondary | ICD-10-CM | POA: Insufficient documentation

## 2021-12-23 DIAGNOSIS — R293 Abnormal posture: Secondary | ICD-10-CM | POA: Diagnosis present

## 2021-12-23 DIAGNOSIS — M25551 Pain in right hip: Secondary | ICD-10-CM | POA: Insufficient documentation

## 2021-12-23 DIAGNOSIS — Z483 Aftercare following surgery for neoplasm: Secondary | ICD-10-CM | POA: Insufficient documentation

## 2021-12-23 DIAGNOSIS — M25611 Stiffness of right shoulder, not elsewhere classified: Secondary | ICD-10-CM | POA: Insufficient documentation

## 2021-12-23 NOTE — Therapy (Signed)
[facility] @ [place], [place], [address] Phone: [phone]   Fax:  [phone]  Physical Therapy Treatment  Patient Details  Name: Christine Lozano MRN: [id number] Date of Birth: [birth year] Referring Provider (PT): Dr. Autumn Messing   Encounter Date: 12/23/2021   PT End of Session - 12/23/21 1022     Visit Number 17    Number of Visits 20    Date for PT Re-Evaluation 01/04/22    PT Start Time 1005    PT Stop Time 1055    PT Time Calculation (min) 50 min    Activity Tolerance Patient tolerated treatment well    Behavior During Therapy [facility] for tasks assessed/performed             Past Medical History:  Diagnosis Date   Facial twitching    Hypertension    Personal history of radiation therapy     Past Surgical History:  Procedure Laterality Date   BREAST BIOPSY Right 08/04/2021   BREAST CYST EXCISION Left 7989,[id number]   x2   BREAST LUMPECTOMY     BUNIONECTOMY Left 10 years ago   [place] Right 09/27/2021   Procedure: RIGHT MASTECTOMY WITH SENTINEL LYMPH NODE BIOPSY;  Surgeon: Jovita Kussmaul, MD;  Location: [facility];  Service: General;  Laterality: Right;   TOOTH EXTRACTION      There were no vitals filed for this visit.   Subjective Assessment - 12/23/21 1004     Subjective I think this is the last session. I feel ready to graduate. Cording feels 90% better. very little limitation in daily activities.  Feel it some with zipping a garment in the back. Cant quite reach the top of the shower door.    Pertinent History Patient was diagnosed on 07/29/2021 with right grade II invasive ductal carcinoma breast cancer. She underwent a right mastectomy and sentinel node biopsy (2 negative nodes) on 09/27/2021 It is ER/PR positive and HER2 negative. She has a history of right breast cancer in 2001 with a lumpectomy, sentinel node biopsy, and radiation.    Patient Stated Goals Get my arm better     Currently in Pain? No/denies    Pain Score 0-No pain    Multiple Pain Sites No                [facility] PT Assessment - 12/23/21 0001       Assessment   Medical Diagnosis s/p right lumpectomy and SLNB    Referring Provider (PT) Dr. Autumn Messing    Onset Date/Surgical Date 09/27/21    Hand Dominance Left      AROM   Right Shoulder Extension 47 Degrees    Right Shoulder Flexion 148 Degrees    Right Shoulder ABduction 140 Degrees    Right Shoulder Internal Rotation 63 Degrees    Right Shoulder External Rotation 105 Degrees                           [facility] Adult PT Treatment/Exercise - 12/23/21 0001       Shoulder Exercises: Supine   Other Supine Exercises Supine wand flex and scaption x 5      Shoulder Exercises: Standing   Other Standing Exercises abduction wall slides x 5      Manual Therapy   Myofascial Release In Supine to Rt axilla, medial upper arm and R chest where cording is palpable  Passive ROM In Supine to Rt shoulder into flexion and abduction                     PT Education - 12/23/21 1534     Education Details HEP updated with supine wand flexion and scaption    Person(s) Educated Patient    Methods Handout;Explanation    Comprehension Verbalized understanding;Returned demonstration                 PT Long Term Goals - 12/23/21 1012       PT LONG TERM GOAL #1   Title Patient will demonstrate she has regained full shoulder ROM and function post op compared to baselines.    Baseline great progress but not full; 12/07/21- still tightness at end range on R side; 12/14/21- pt has returned to baseline    Period Weeks    Status Achieved    Target Date 12/16/21      PT LONG TERM GOAL #2   Baseline 81 post op and 145 pre op, 136 11/18/2021; 12/14/21- 165    Time 4    Period Weeks    Status Achieved    Target Date 12/16/21      PT LONG TERM GOAL #3   Title Patient will increase right shoulder active abduction to >/= 140  degrees for increased ease tolerating daily tasks.    Time 4    Period Weeks    Status Achieved    Target Date 12/16/21      PT LONG TERM GOAL #4   Title Patient will verbalize good understanding of lymphedema risk reduction practices.    Baseline 12/14/21- pt educated today    Time 4    Period Weeks    Status Achieved    Target Date 12/16/21      PT LONG TERM GOAL #5   Title Patient will imporve her DASH score to be </= 20 for improved overall UE function.    Baseline 54.55 post op; 0 pre-op, 22.73 today 12/29; 12/14/21- 0    Period Weeks    Status Achieved    Target Date 12/16/21      PT LONG TERM GOAL #6   Title Pt will no longer demonstrate end range tightness on R side secondary to cording    Baseline mild tightness at end range    Time 4    Period Weeks    Status Not Met    Target Date 12/23/21      PT LONG TERM GOAL #7   Title Pt will report a 75% improvement in bilateral hip pain and have knowledge of stretching to help prevent hip pain    Time 4    Period Weeks    Status Achieved                   Plan - 12/23/21 1058     Clinical Impression Statement Pt has achieved all but 1 goal established at initial evaluation.  Cording is still present, but improved by 90% per pt.Right shoulder ROM is now significantly improved but with limitation noted greatest with abduction limited by axillary cording.  Quick dash is now with 0 functional limitation, and pt was educated on precautions for prevention of lymphedema. She is compliant with HEP and she was advised to be sure and include abduction wall slides several times per day as this is her greatest limitation. She felt ready for release to HEP and was encouraged to continue  her daily stretching.  She was advised to contact us with any questions or concerns.    Stability/Clinical Decision Making Stable/Uncomplicated    Rehab Potential Excellent    PT Frequency 2x / week    PT Duration 4 weeks    PT  Treatment/Interventions ADLs/Self Care Home Management;Therapeutic exercise;Patient/family education;Passive range of motion;Manual techniques;Manual lymph drainage;Scar mobilization;Therapeutic activities    PT Next Visit Plan Pt discharged at her request    PT Home Exercise Plan Post op HEP; end ROM stretching in doorway, single arm chest/NTS on wall, pec stretch, abd wall slides, supine wand flex and scaption.    Consulted and Agree with Plan of Care Patient           PHYSICAL THERAPY DISCHARGE SUMMARY   Patient will benefit from skilled therapeutic intervention in order to improve the following deficits and impairments:  Postural dysfunction, Decreased range of motion, Decreased knowledge of precautions, Impaired UE functional use, Pain, Decreased scar mobility, Increased fascial restricitons  Visit Diagnosis: Aftercare following surgery for neoplasm  Stiffness of right shoulder, not elsewhere classified  Pain in right hip  Pain in left hip  Abnormal posture     Problem List Patient Active Problem List   Diagnosis Date Noted   Cancer of right female breast ([place]) 09/27/2021   Malignant neoplasm of lower-inner quadrant of right female breast ([place]) 08/10/2021   Meige syndrome (blepharospasm with oromandibular dystonia) 03/16/2021   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  She continues with a thick cord and several smaller cords limiting at end range especially abd.   Remaining deficits: Limitation in abduction ROM due to cords   Education / Equipment: TG soft sleeve   Patient agrees to discharge. Patient goals were  nearly all met. . Patient is being discharged due to being pleased with the current functional level.   Claris Pong, PT 12/23/2021, 3:36  PM  [facility] @ [place], [place], [address] Phone: [phone]   Fax:  [phone]  Name: Christine Lozano MRN: [id number] Date of Birth: 1956/08/29

## 2021-12-23 NOTE — Patient Instructions (Signed)
SHOULDER: Flexion - Supine (Cane)        Cancer Rehab [phone]    Hold cane in both hands. Raise arms up overhead. Do not allow back to arch. Hold _5__ seconds. Do __5-10__ times; __1-2__ times a day.   Hands shoulder width apart 2  Hands slightly wider than shoulder width (V position)

## 2022-01-03 ENCOUNTER — Ambulatory Visit: Payer: Self-pay

## 2022-01-17 ENCOUNTER — Other Ambulatory Visit: Payer: Self-pay | Admitting: Hematology and Oncology

## 2022-02-18 ENCOUNTER — Telehealth: Payer: Self-pay | Admitting: Hematology and Oncology

## 2022-02-18 NOTE — Telephone Encounter (Signed)
.  Called pt per 2/24 inbasket , Patient was unavailable, a message with appt time and date was left with number on file.   ?

## 2022-02-28 ENCOUNTER — Ambulatory Visit: Payer: Medicare Other | Attending: General Surgery

## 2022-02-28 VITALS — Wt 136.2 lb

## 2022-02-28 DIAGNOSIS — Z483 Aftercare following surgery for neoplasm: Secondary | ICD-10-CM | POA: Insufficient documentation

## 2022-02-28 NOTE — Therapy (Signed)
?  OUTPATIENT PHYSICAL THERAPY SOZO SCREENING NOTE ? ? ?Patient Name: Christine Lozano ?MRN: [id number] ?DOB:[birth year], 66 y.o., female ?Today's Date: 02/28/2022 ? ?PCP: Fanny Bien, MD ?REFERRING PROVIDER: Jovita Kussmaul, MD ? ? PT End of Session - 02/28/22 0924   ? ? Visit Number 17   # unchanged due to screen only  ? PT Start Time [phone]   ? PT Stop Time 9562   ? PT Time Calculation (min) 4 min   ? Activity Tolerance Patient tolerated treatment well   ? Behavior During Therapy [facility] for tasks assessed/performed   ? ?  ?  ? ?  ? ? ?Past Medical History:  ?Diagnosis Date  ? Facial twitching   ? Hypertension   ? Personal history of radiation therapy   ? ?Past Surgical History:  ?Procedure Laterality Date  ? BREAST BIOPSY Right 08/04/2021  ? BREAST CYST EXCISION Left 1308,[id number]  ? x2  ? BREAST LUMPECTOMY    ? BUNIONECTOMY Left 10 years ago  ? MASTECTOMY W/ SENTINEL NODE BIOPSY Right 09/27/2021  ? Procedure: RIGHT MASTECTOMY WITH SENTINEL LYMPH NODE BIOPSY;  Surgeon: Jovita Kussmaul, MD;  Location: [facility];  Service: General;  Laterality: Right;  ? TOOTH EXTRACTION    ? ?Patient Active Problem List  ? Diagnosis Date Noted  ? Cancer of right female breast ([place]) 09/27/2021  ? Malignant neoplasm of lower-inner quadrant of right female breast ([place]) 08/10/2021  ? 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 ? ?REFERRING DIAG: right breast cancer at risk for lymphedema ? ?THERAPY DIAG:  ?Aftercare following surgery for neoplasm ? ?PERTINENT HISTORY: Patient was diagnosed on 07/29/2021 with right grade II invasive ductal carcinoma breast cancer. She underwent a right mastectomy and sentinel node biopsy (2 negative nodes) on 09/27/2021 It is ER/PR positive and HER2 negative. She has a history of right breast cancer in 2001 with a lumpectomy, sentinel node biopsy, and radiation.  ? ?PRECAUTIONS: right UE Lymphedema risk, None ? ?SUBJECTIVE: Pt returns for her 3 month  L-Dex screen.  ? ?PAIN:  ?Are you having pain? No ? ?SOZO SCREENING: ?Patient was assessed today using the SOZO machine to determine the lymphedema index score. This was compared to her baseline score. It was determined that she is within the recommended range when compared to her baseline and no further action is needed at this time. She will continue SOZO screenings. These are done every 3 months for 2 years post operatively followed by every 6 months for 2 years, and then annually. ? ? ? ? ?Otelia Limes, PTA ?02/28/2022, 9:32 AM ? ?  ? ?

## 2022-06-13 ENCOUNTER — Telehealth: Payer: Self-pay | Admitting: *Deleted

## 2022-06-13 NOTE — Telephone Encounter (Signed)
Received call from pt with complaint of generalized joint pain x1 month while on Anastrozole.  Per MD pt needing to stop Anastrozole x 2 weeks to see if symptoms resolve.  If symptoms completley resolve, verbal orders received by MD for pt to start Letrozole 2.5 mg tablet p.o daily and stop Anastrozole.  Pt educated to stop Anastrozole x2 weeks and call our office with an update.  Pt verbalized understanding.

## 2022-07-18 ENCOUNTER — Ambulatory Visit: Payer: Medicare Other

## 2022-07-29 ENCOUNTER — Ambulatory Visit (INDEPENDENT_AMBULATORY_CARE_PROVIDER_SITE_OTHER): Payer: Medicare Other | Admitting: Family Medicine

## 2022-07-29 ENCOUNTER — Encounter: Payer: Self-pay | Admitting: Family Medicine

## 2022-07-29 VITALS — BP 122/74 | HR 66 | Temp 99.0°F | Ht 64.5 in | Wt 140.0 lb

## 2022-07-29 DIAGNOSIS — R600 Localized edema: Secondary | ICD-10-CM

## 2022-07-29 DIAGNOSIS — C50311 Malignant neoplasm of lower-inner quadrant of right female breast: Secondary | ICD-10-CM | POA: Diagnosis not present

## 2022-07-29 DIAGNOSIS — Z7689 Persons encountering health services in other specified circumstances: Secondary | ICD-10-CM

## 2022-07-29 DIAGNOSIS — Z170 Estrogen receptor positive status [ER+]: Secondary | ICD-10-CM

## 2022-08-01 ENCOUNTER — Ambulatory Visit: Payer: Medicare Other | Attending: General Surgery

## 2022-08-01 VITALS — Wt 138.2 lb

## 2022-08-01 DIAGNOSIS — Z483 Aftercare following surgery for neoplasm: Secondary | ICD-10-CM | POA: Insufficient documentation

## 2022-08-01 NOTE — Therapy (Signed)
  OUTPATIENT PHYSICAL THERAPY SOZO SCREENING NOTE   Patient Name: Christine Lozano MRN: [id number] DOB:[birth year], 67 y.o., female Today's Date: 08/01/2022  PCP: Billie Ruddy, MD REFERRING PROVIDER: Jovita Kussmaul, MD   PT End of Session - 08/01/22 0953     Visit Number 17   # unchanged due to screen only   PT Start Time 0951    PT Stop Time 0956    PT Time Calculation (min) 5 min    Activity Tolerance Patient tolerated treatment well    Behavior During Therapy [facility] for tasks assessed/performed             Past Medical History:  Diagnosis Date   Facial twitching    Hypertension    Personal history of radiation therapy    Past Surgical History:  Procedure Laterality Date   BREAST BIOPSY Right 08/04/2021   BREAST CYST EXCISION Left [id number]   x2   BREAST LUMPECTOMY     BUNIONECTOMY Left 10 years ago   MASTECTOMY W/ SENTINEL NODE BIOPSY Right 09/27/2021   Procedure: RIGHT MASTECTOMY WITH SENTINEL LYMPH NODE BIOPSY;  Surgeon: Jovita Kussmaul, MD;  Location: [place];  Service: General;  Laterality: Right;   TOOTH EXTRACTION     Patient Active Problem List   Diagnosis Date Noted   Cancer of right female breast ([place]) 09/27/2021   Malignant neoplasm of lower-inner quadrant of right female breast ([place]) 08/10/2021   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 She underwent a right mastectomy and sentinel node biopsy (2 negative nodes) on 09/27/2021 It is ER/PR positive and HER2 negative. She has a history of right breast cancer in 2001 with a lumpectomy, sentinel node biopsy, and radiation.   PRECAUTIONS: right UE Lymphedema risk, None  SUBJECTIVE: Pt returns for her 3 month L-Dex  screen.   PAIN:  Are you having pain? No  SOZO SCREENING: Patient was assessed today using the SOZO machine to determine the lymphedema index score. This was compared to her baseline score. It was determined that she is within the recommended range when compared to her baseline and no further action is needed at this time. She will continue SOZO screenings. These are done every 3 months for 2 years post operatively followed by every 6 months for 2 years, and then annually.     Otelia Limes, PTA 08/01/2022, 9:56 AM

## 2022-08-02 ENCOUNTER — Encounter: Payer: Self-pay | Admitting: Family Medicine

## 2022-08-02 NOTE — Progress Notes (Signed)
Subjective:    Patient ID: Christine Lozano, female    DOB: 07/21/1956, 66 y.o.   MRN: [id number]  Chief Complaint  Patient presents with   Establish Care    May have arthritis, wants to figure     HPI Patient is a 66 yo female who was seen today for est care and f/u.  Pt previously seen by Rachell Cipro, MD. Pt states she is overall healthy.  Pt inquires if she has arthritis.  States fingers swell, more noticeable at night.  Denies pain.  Has stiffness in am.  No difficulty getting rings on or off.  Had a bag of chips the other night.  Pt with h/o R beast cancer, s/p R mastectomy Nov 2022.  On anastrazole 1 mg daily.  Followed by OB/Gyn, Dr. Candie Mile  Per chart review, h/o blepharospasms.  Receiving B[facility] injections at [facility].  Prior h/o HTN? In the past was on norvasc 5 mg daily.  Allergies: NKDA  Social hx:  Pt is married.  Pt is a retired Automotive engineer. Pt states she is a private person.  Didn't tell her husband about ca dx until 1 wk prior to surgery.  Pt denies EtOH, tobacco, and drug use.  Past Medical History:  Diagnosis Date   Facial twitching    Hypertension    Personal history of radiation therapy     No Known Allergies  ROS General: Denies fever, chills, night sweats, changes in weight, changes in appetite HEENT: Denies headaches, ear pain, changes in vision, rhinorrhea, sore throat CV: Denies CP, palpitations, SOB, orthopnea Pulm: Denies SOB, cough, wheezing GI: Denies abdominal pain, nausea, vomiting, diarrhea, constipation GU: Denies dysuria, hematuria, frequency, vaginal discharge Msk: Denies muscle cramps, joint pains Neuro: Denies weakness, numbness, tingling Skin: Denies rashes, bruising  +edema of b/l hands Psych: Denies depression, anxiety, hallucinations     Objective:    Blood pressure 122/74, pulse 66, temperature 99 F (37.2 C), temperature source Oral, height 5' 4.5" (1.638 m), weight 140 lb (63.5 kg), SpO2 99 %.  Gen. Pleasant,  well-nourished, in no distress, normal affect   HEENT: /AT, face symmetric, conjunctiva clear, no scleral icterus, PERRLA, EOMI, nares patent without drainage Lungs: no accessory muscle use, CTAB, no wheezes or rales Cardiovascular: RRR, no m/r/g, no peripheral edema Musculoskeletal: b/l hands, fingers, and wrist without edema, erythema, deformities.  Negative tinnel's and phalen's.  No cyanosis or clubbing, normal tone Neuro:  A&Ox3, CN II-XII intact, normal gait Skin:  Warm, no lesions/ rash  Wt Readings from Last 3 Encounters:  08/01/22 138 lb 4 oz (62.7 kg)  07/29/22 140 lb (63.5 kg)  02/28/22 136 lb 4 oz (61.8 kg)    Lab Results  Component Value Date   WBC 5.0 09/23/2021   HGB 12.3 09/23/2021   HCT 38.5 09/23/2021   PLT 197 09/23/2021   GLUCOSE 120 (H) 09/23/2021   ALT 13 08/11/2021   AST 20 08/11/2021   NA 140 09/23/2021   K 3.9 09/23/2021   CL 107 09/23/2021   CREATININE 0.71 09/23/2021   BUN 6 (L) 09/23/2021   CO2 29 09/23/2021    Assessment/Plan:  Malignant neoplasm of lower-inner quadrant of right breast of female, estrogen receptor positive (HCC) -s/p mastectomy -continue anastrazole 1 mg daily -continue regular mammograms -continue f/u with Oncology  Hand edema -no edema or deformity on exam -may be a/w increased sodium intake as pt had chips one night.  Also consider medications. -discussed reading labels/lifestyle modifications -f/u prn  for continued symptom  Encounter to establish care -We reviewed the PMH, PSH, FH, SH, Meds and Allergies. -We provided refills for any medications we will prescribe as needed. -We addressed current concerns per orders and patient instructions. -We have asked for records for pertinent exams, studies, vaccines and notes from previous providers. -We have advised patient to follow up per instructions below.  F/u prn   Grier Mitts, MD

## 2022-08-10 ENCOUNTER — Other Ambulatory Visit: Payer: Self-pay | Admitting: Adult Health

## 2022-08-10 ENCOUNTER — Ambulatory Visit
Admission: RE | Admit: 2022-08-10 | Discharge: 2022-08-10 | Disposition: A | Discharge status: Home / Self Care | Payer: Medicare Other | Source: Ambulatory Visit | Attending: Adult Health | Admitting: Adult Health

## 2022-08-10 ENCOUNTER — Telehealth: Payer: Self-pay

## 2022-08-10 DIAGNOSIS — Z170 Estrogen receptor positive status [ER+]: Secondary | ICD-10-CM

## 2022-08-10 DIAGNOSIS — Z1231 Encounter for screening mammogram for malignant neoplasm of breast: Secondary | ICD-10-CM

## 2022-08-10 NOTE — Telephone Encounter (Signed)
Pt called and states since stopping anastrozole per previous telephone encounter 06/13/22, sx have not relieved. Pt describes BUE; BLE swelling and discomfort of joints feeling "uncomfortable". Since pt's sx were not relieved by stopping anastrozole, I offered the pt appt with Wilber Bihari, NP 08/11/22 at 0815. Pt agreed and verbalized thanks.

## 2022-08-11 ENCOUNTER — Inpatient Hospital Stay: Payer: Medicare Other | Attending: Adult Health | Admitting: Adult Health

## 2022-08-11 ENCOUNTER — Other Ambulatory Visit: Payer: Self-pay

## 2022-08-11 ENCOUNTER — Encounter: Payer: Self-pay | Admitting: Adult Health

## 2022-08-11 VITALS — BP 145/73 | HR 64 | Temp 97.5°F | Resp 18 | Ht 64.5 in | Wt 140.2 lb

## 2022-08-11 DIAGNOSIS — M25541 Pain in joints of right hand: Secondary | ICD-10-CM | POA: Diagnosis not present

## 2022-08-11 DIAGNOSIS — Z79811 Long term (current) use of aromatase inhibitors: Secondary | ICD-10-CM | POA: Diagnosis not present

## 2022-08-11 DIAGNOSIS — Z9011 Acquired absence of right breast and nipple: Secondary | ICD-10-CM | POA: Insufficient documentation

## 2022-08-11 DIAGNOSIS — C50311 Malignant neoplasm of lower-inner quadrant of right female breast: Secondary | ICD-10-CM | POA: Insufficient documentation

## 2022-08-11 DIAGNOSIS — M25542 Pain in joints of left hand: Secondary | ICD-10-CM | POA: Insufficient documentation

## 2022-08-11 DIAGNOSIS — Z170 Estrogen receptor positive status [ER+]: Secondary | ICD-10-CM | POA: Diagnosis not present

## 2022-08-11 DIAGNOSIS — E2839 Other primary ovarian failure: Secondary | ICD-10-CM | POA: Diagnosis not present

## 2022-08-11 NOTE — Progress Notes (Signed)
[place] Follow up:    Billie Ruddy, MD [address] 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  MRI showed 2.1 cm retroareolar mass plus additional 1.2 cm mass (not biopsied), biopsy revealed grade 2 IDC prognostic panel pending   08/11/2021 Cancer Staging   Staging form: Breast, AJCC 8th Edition - Clinical: Stage IB (cT2, cN0, cM0, G2, ER+, PR+, HER2-) - Signed by Nicholas Lose, MD on 08/11/2021 Histologic grading system: 3 grade system   07/2021 - 07/2026 Anti-estrogen oral therapy   Anastrozole 1 mg tablet daily.   09/27/2021 Surgery   Right mastectomy: Grade 2 IDC 2.1 cm with high-grade DCIS, separate focus of DCIS intermediate grade 1 cm involving CSL, margins negative, 0/2 lymph nodes negative, ER 95%, PR 90%, HER2 negative, Ki-67 10%   09/27/2021 Surgery   Right breast mastectomy with sentinel node biopsy   Cancer of right female breast ([place])  07/2021 - 07/2026 Anti-estrogen oral therapy   Anastrozole 1 mg tablet daily.   09/27/2021 Initial Diagnosis   Cancer of right female breast ([place])   09/27/2021 Surgery   Right breast mastectomy with sentinel node biopsy     CURRENT THERAPY: Anastrozole  INTERVAL HISTORY: JESS SULAK 66 y.o. female returns for evaluation of stiffness in her joints.  She notes that this began about 4 weeks ago (she started anastrozole about  one year ago).  She held anastrozole x 2 weeks and the stiffness and arthralgias did not subside.    She denies any breast concerns.  She has restarted the anastrozole.  She denies any swelling or heaviness in her arms bilaterally.     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  MEDICAL HISTORY: Past Medical History:  Diagnosis Date   Facial twitching    Hypertension    Personal history of radiation therapy     SURGICAL HISTORY: Past Surgical History:  Procedure Laterality Date   BREAST BIOPSY Right 08/04/2021   BREAST CYST EXCISION Left [id number]   x2   BREAST LUMPECTOMY     BUNIONECTOMY Left 10 years ago   MASTECTOMY W/ SENTINEL NODE BIOPSY Right 09/27/2021   Procedure: RIGHT MASTECTOMY WITH SENTINEL LYMPH NODE BIOPSY;  Surgeon: Jovita Kussmaul, MD;  Location: [facility];  Service: General;  Laterality: Right;   TOOTH EXTRACTION      SOCIAL HISTORY: Social History   Socioeconomic History   Marital status: Married    Spouse name: Not on file   Number of children: 2   Years of education: college   Highest education level: Master's degree (e.g., MA, MS, MEng, MEd, MSW, MBA)  Occupational History  Occupation: Retired  Tobacco Use   Smoking status: Never   Smokeless tobacco: Never  Vaping Use   Vaping Use: Never used  Substance and Sexual Activity   Alcohol use: No   Drug use: No   Sexual activity: Not on file  Other Topics Concern   Not on file  Social History Narrative   Lives at home with her husband.   Left-handed.   No daily caffeine use.   Social Determinants of Health   Financial Resource Strain: Low Risk  (12/13/2021)   Overall Financial  Resource Strain (CARDIA)    Difficulty of Paying Living Expenses: Not hard at all  Food Insecurity: No Food Insecurity (12/13/2021)   Hunger Vital Sign    Worried About Running Out of Food in the Last Year: Never true    Ran Out of Food in the Last Year: Never true  Transportation Needs: No Transportation Needs (12/13/2021)   PRAPARE - Hydrologist (Medical): No    Lack of Transportation (Non-Medical): No  Physical Activity: Sufficiently Active (12/13/2021)   Exercise Vital Sign    Days of Exercise per Week: 3 days    Minutes of Exercise per Session: 60 min  Stress: No Stress Concern Present (12/13/2021)   [place]    Feeling of Stress : Not at all  Social Connections: [place] (12/13/2021)   Social Connection and Isolation Panel [NHANES]    Frequency of Communication with Friends and Family: More than three times a week    Frequency of Social Gatherings with Friends and Family: Three times a week    Attends Religious Services: More than 4 times per year    Active Member of Clubs or Organizations: Yes    Attends Archivist Meetings: 1 to 4 times per year    Marital Status: Married  Human resources officer Violence: Not At Risk (12/13/2021)   Humiliation, Afraid, Rape, and Kick questionnaire    Fear of Current or Ex-Partner: No    Emotionally Abused: No    Physically Abused: No    Sexually Abused: No    FAMILY HISTORY: Family History  Problem Relation Age of Onset   Cancer Mother    Diabetes Father    Kidney disease Brother    Diabetes Brother    Colon cancer Neg Hx     Review of Systems  Constitutional:  Negative for appetite change, chills, fatigue, fever and unexpected weight change.  HENT:   Negative for hearing loss, lump/mass and trouble swallowing.   Eyes:  Negative for eye problems and icterus.  Respiratory:  Negative for chest tightness, cough and shortness of  breath.   Cardiovascular:  Negative for chest pain, leg swelling and palpitations.  Gastrointestinal:  Negative for abdominal distention, abdominal pain, constipation, diarrhea, nausea and vomiting.  Endocrine: Negative for hot flashes.  Genitourinary:  Negative for difficulty urinating.   Musculoskeletal:  Positive for arthralgias.  Skin:  Negative for itching and rash.  Neurological:  Negative for dizziness, extremity weakness, headaches and numbness.  Hematological:  Negative for adenopathy. Does not bruise/bleed easily.  Psychiatric/Behavioral:  Negative for depression. The patient is not nervous/anxious.       PHYSICAL EXAMINATION  ECOG PERFORMANCE STATUS: 1 - Symptomatic but completely ambulatory  Vitals:   08/11/22 0840  BP: (!) 145/73  Pulse: 64  Resp: 18  Temp: (!) 97.5 F (36.4 C)  SpO2: 100%    Physical Exam Constitutional:  General: She is not in acute distress.    Appearance: Normal appearance. She is not toxic-appearing.  HENT:     Head: Normocephalic and atraumatic.  Eyes:     General: No scleral icterus. Cardiovascular:     Rate and Rhythm: Normal rate and regular rhythm.     Pulses: Normal pulses.     Heart sounds: Normal heart sounds.  Pulmonary:     Effort: Pulmonary effort is normal.     Breath sounds: Normal breath sounds.  Abdominal:     General: Abdomen is flat. Bowel sounds are normal. There is no distension.     Palpations: Abdomen is soft.     Tenderness: There is no abdominal tenderness.  Musculoskeletal:        General: No swelling.     Cervical back: Neck supple.     Comments: Fingers and joints bilaterally appear non swollen, no TTP, no swelling in either extremity   Lymphadenopathy:     Cervical: No cervical adenopathy.  Skin:    General: Skin is warm and dry.     Findings: No rash.  Neurological:     General: No focal deficit present.     Mental Status: She is alert.  Psychiatric:        Mood and Affect: Mood normal.         Behavior: Behavior normal.     LABORATORY DATA: None for this visit   RADIOGRAPHIC STUDIES:  MM 3D SCREEN BREAST UNI LEFT  Result Date: 08/11/2022 CLINICAL DATA:  Screening. EXAM: DIGITAL SCREENING UNILATERAL LEFT MAMMOGRAM WITH CAD AND TOMOSYNTHESIS TECHNIQUE: Left screening digital craniocaudal and mediolateral oblique mammograms were obtained. Left screening digital breast tomosynthesis was performed. The images were evaluated with computer-aided detection. COMPARISON:  Previous exam(s). ACR Breast Density Category c: The breast tissue is heterogeneously dense, which may obscure small masses. FINDINGS: The patient has had a right mastectomy. There are no findings suspicious for malignancy. IMPRESSION: No mammographic evidence of malignancy. A result letter of this screening mammogram will be mailed directly to the patient. RECOMMENDATION: Screening mammogram in one year.  (Code:SM-L-59M) BI-RADS CATEGORY  1: Negative. Electronically Signed   By: Ammie Ferrier M.D.   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  Since these are bilaterally and worse in the morning this is consistent with osteoarthritis.  I suggested she try Tylenol arthritis.  More natural remedies she could try include turmeric or glucosamine chondroitin. Should her fingers worsen or not improve despite the above I recommended that she follow-up with her primary care provider Dr. Volanda Napoleon for consideration of lab testing for autoimmune causes.  I reviewed with Jackalynn that it is unlikely that the anastrozole caused the arthralgias since she has been on anastrozole for a year and when she stopped anastrozole the arthralgias did not improve.  Charizma will return on November 21 for follow-up with Dr. Lindi Adie.  She has not undergone bone  density testing so I placed orders for this to take place at [facility].     All questions were answered. The patient knows to call the clinic with any problems, questions or concerns. We can certainly see the patient much sooner if necessary.  Total encounter time:20 minutes*in face-to-face visit time, chart review, lab review, care coordination, order entry, and documentation  of the encounter time.    Wilber Bihari, NP 08/11/22 9:11 AM Medical Oncology and Hematology [facility] [place], [place] [address] Tel. [phone]    Fax. [phone]  *Total Encounter Time as defined by the Centers for Medicare and Medicaid Services includes, in addition to the face-to-face time of a patient visit (documented in the note above) non-face-to-face time: obtaining and reviewing outside history, ordering and reviewing medications, tests or procedures, care coordination (communications with other health care professionals or caregivers) and documentation in the medical record.

## 2022-08-11 NOTE — Assessment & Plan Note (Addendum)
Christine Lozano is here for follow-up of her breast cancer for urgent evaluation of arthralgias in her fingertips.  Since these are bilaterally and worse in the morning this is consistent with osteoarthritis.  I suggested she try Tylenol arthritis.  More natural remedies she could try include turmeric or glucosamine chondroitin. Should her fingers worsen or not improve despite the above I recommended that she follow-up with her primary care provider Dr. Volanda Napoleon for consideration of lab testing for autoimmune causes.  I reviewed with Christine Lozano that it is unlikely that the anastrozole caused the arthralgias since she has been on anastrozole for a year and when she stopped anastrozole the arthralgias did not improve.  Christine Lozano will return on November 21 for follow-up with Dr. Lindi Adie.  She has not undergone bone density testing so I placed orders for this to take place at [facility].

## 2022-08-15 ENCOUNTER — Telehealth: Payer: Self-pay

## 2022-08-15 ENCOUNTER — Ambulatory Visit (HOSPITAL_BASED_OUTPATIENT_CLINIC_OR_DEPARTMENT_OTHER)
Admission: RE | Admit: 2022-08-15 | Discharge: 2022-08-15 | Disposition: A | Discharge status: Home / Self Care | Payer: Medicare Other | Source: Ambulatory Visit | Attending: Adult Health | Admitting: Adult Health

## 2022-08-15 DIAGNOSIS — E2839 Other primary ovarian failure: Secondary | ICD-10-CM | POA: Diagnosis not present

## 2022-08-15 NOTE — Telephone Encounter (Addendum)
Called pt with results of bone density scan. Patient agreed to virtual visit with Mendel Ryder NP week of 08/15/22. In basket message sent to scheduler.  Scheduler called pt and patient did not want to schedule appointment at this time. Pt will call clinic when she is ready to make appointment.

## 2022-10-11 ENCOUNTER — Other Ambulatory Visit: Payer: Self-pay

## 2022-10-11 ENCOUNTER — Inpatient Hospital Stay: Payer: Medicare Other | Attending: Adult Health | Admitting: Hematology and Oncology

## 2022-10-11 VITALS — BP 150/84 | HR 67 | Temp 98.3°F | Resp 18 | Ht 64.5 in | Wt 140.8 lb

## 2022-10-11 DIAGNOSIS — Z79811 Long term (current) use of aromatase inhibitors: Secondary | ICD-10-CM | POA: Insufficient documentation

## 2022-10-11 DIAGNOSIS — M810 Age-related osteoporosis without current pathological fracture: Secondary | ICD-10-CM | POA: Diagnosis not present

## 2022-10-11 DIAGNOSIS — Z170 Estrogen receptor positive status [ER+]: Secondary | ICD-10-CM | POA: Diagnosis not present

## 2022-10-11 DIAGNOSIS — C50311 Malignant neoplasm of lower-inner quadrant of right female breast: Secondary | ICD-10-CM | POA: Insufficient documentation

## 2022-10-11 MED ORDER — ANASTROZOLE 1 MG PO TABS: 1.0000 mg | ORAL_TABLET | Freq: Every day | ORAL | 3 refills | Status: DC

## 2022-10-11 MED ORDER — ALENDRONATE SODIUM 70 MG PO TABS: 70.0000 mg | ORAL_TABLET | ORAL | 3 refills | Status: DC

## 2022-10-11 MED ORDER — TURMERIC 500 MG PO CAPS: 1.0000 | ORAL_CAPSULE | Freq: Every day | ORAL | Status: DC

## 2022-10-11 NOTE — Progress Notes (Signed)
Patient Care Team: Billie Ruddy, MD as PCP - General (Family Medicine) Calvert Cantor, MD as Consulting Physician (Ophthalmology) Jovita Kussmaul, MD as Consulting Physician (General Surgery) Nicholas Lose, MD as Consulting Physician (Hematology and Oncology) Eppie Gibson, MD as Attending Physician (Radiation Oncology) Delice Bison Charlestine Massed, NP as Nurse Practitioner (Hematology and Oncology) Harmon Pier, RN as Registered Nurse  DIAGNOSIS:  Encounter Diagnosis  Name Primary?   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  MRI showed 2.1 cm retroareolar mass plus additional 1.2 cm mass (not biopsied), biopsy revealed grade 2 IDC prognostic panel pending   08/11/2021 Cancer Staging   Staging form: Breast, AJCC 8th Edition - Clinical: Stage IB (cT2, cN0, cM0, G2, ER+, PR+, HER2-) - Signed by Nicholas Lose, MD on 08/11/2021 Histologic grading system: 3 grade system   07/2021 - 07/2026 Anti-estrogen oral therapy   Anastrozole 1 mg tablet daily.   09/27/2021 Surgery   Right mastectomy: Grade 2 IDC 2.1 cm with high-grade DCIS, separate focus of DCIS intermediate grade 1 cm involving CSL, margins negative, 0/2 lymph nodes negative, ER 95%, PR 90%, HER2 negative, Ki-67 10%   09/27/2021 Surgery   Right breast mastectomy with sentinel node biopsy   Cancer of right female breast ([place])  07/2021 - 07/2026 Anti-estrogen oral therapy   Anastrozole 1 mg tablet daily.   09/27/2021 Initial Diagnosis   Cancer of right female breast ([place])   09/27/2021 Surgery   Right breast mastectomy with sentinel node biopsy      CHIEF COMPLIANT:  Follow-up of right breast cancer on anastrozole    INTERVAL HISTORY: Christine Lozano is a 66 y.o. with above-mentioned history of right breast cancer. 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 Currently on anastrozole therapy.  She presents to the clinic today for follow-up. She reports that she was having some joint stiffness in fingers. It mainly happens at night. Overall she is tolerating the anastrozole with no other symptoms or side effects.   ALLERGIES:  has No Known Allergies.  MEDICATIONS:  Current Outpatient Medications  Medication Sig Dispense Refill   alendronate (FOSAMAX) 70 MG tablet Take 1 tablet (70 mg total) by mouth once a week. Take with a full glass of water on an empty stomach. 12 tablet 3   amLODipine (NORVASC) 5 MG tablet Take 5 mg by mouth daily.     Calcium Carb-Cholecalciferol (CALCIUM 1000 + D PO) Take 2 tablets by mouth in the morning and at bedtime.     Cholecalciferol (VITAMIN D3) 10 MCG/ML LIQD Take 2,000 Units by mouth daily.     incobotulinumtoxinA (XEOMIN) 50 units SOLR injection Inject 50 Units into the muscle every 3 (three) months. 1 each 3   Turmeric 500 MG CAPS Take 1 capsule by mouth daily.     anastrozole (ARIMIDEX) 1 MG tablet Take 1 tablet (1 mg total) by mouth daily. 90 tablet 3   Current Facility-Administered Medications  Medication Dose Route Frequency Provider Last Rate Last Admin   incobotulinumtoxinA (XEOMIN) 50 units injection 50 Units  50 Units Intramuscular  Q90 days Marcial Pacas, MD   50 Units at 03/16/21 0945    PHYSICAL EXAMINATION: ECOG PERFORMANCE STATUS: 1 - Symptomatic but completely ambulatory  Vitals:   10/11/22 1509  BP: (!) 150/84  Pulse: 67  Resp: 18  Temp: 98.3 F (36.8 C)  SpO2: 100%   Filed Weights   10/11/22 1509  Weight: 140 lb 12.8 oz (63.9 kg)      LABORATORY  DATA:  I have reviewed the data as listed    Latest Ref Rng & Units 09/23/2021    3:40 PM 08/11/2021   12:34 PM  CMP  Glucose 70 - 99 mg/dL 120  93   BUN 8 - 23 mg/dL 6  12   Creatinine 0.44 - 1.00 mg/dL 0.71  0.86   Sodium 135 - 145 mmol/L 140  142   Potassium 3.5 - 5.1 mmol/L 3.9  4.1   Chloride 98 - 111 mmol/L 107  106   CO2 22 - 32 mmol/L 29  27   Calcium 8.9 - 10.3 mg/dL 9.0  9.3   Total Protein 6.5 - 8.1 g/dL  7.6   Total Bilirubin 0.3 - 1.2 mg/dL  0.5   Alkaline Phos 38 - 126 U/L  87   AST 15 - 41 U/L  20   ALT 0 - 44 U/L  13     Lab Results  Component Value Date   WBC 5.0 09/23/2021   HGB 12.3 09/23/2021   HCT 38.5 09/23/2021   MCV 86.3 09/23/2021   PLT 197 09/23/2021   NEUTROABS 1.7 08/11/2021    ASSESSMENT & PLAN:  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 Oncotype DX: 17: 5% risk of distant recurrence (no role of chemo) 2. Adjuvant antiestrogen therapy (patient started anastrozole prior to surgery and she will continue with the same) ------------------------------------------------------------------------------------------------------------------------------------- Anastrozole toxicities: Joint stiffness in the hands: I discussed with her about using turmeric to decrease inflammation 2. weight gain: Menopausal symptoms better  Breast cancer surveillance: Mammogram left breast: Benign breast density category C  Osteoporosis: Bone  density is T score -3.2 on 08/15/2022: Calcium vitamin D weightbearing exercise and bisphosphonate therapy.  Sent a prescription for Fosamax.  Return to clinic in 1 year for follow-up    No orders of the defined types were placed in this encounter.  The patient has a good understanding of the overall plan. she agrees with it. she will call with any problems that may develop before the next visit here. Total time spent: 30 mins including face to face time and time spent for planning, charting and co-ordination of care   Harriette Ohara, MD 10/11/22    I Gardiner Coins am scribing for Dr. Lindi Adie  I have reviewed the above documentation for accuracy and completeness, and I agree with the above.

## 2022-10-11 NOTE — Assessment & Plan Note (Addendum)
08/04/2021: Palpable thickening in the right breast retroareolar, ultrasound showed mass at 4:00: 1 cm, axilla negative.  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 Oncotype DX: 17: 5% risk of distant recurrence (no role of chemo) 2. Adjuvant antiestrogen therapy (patient started anastrozole prior to surgery and she will continue with the same) ------------------------------------------------------------------------------------------------------------------------------------- Anastrozole toxicities: Joint stiffness in the hands: I discussed with her about using turmeric to decrease inflammation 2. weight gain: Menopausal symptoms better  Breast cancer surveillance: Mammogram left breast: Benign breast density category C  Osteoporosis: Bone density is T score -3.2 on 08/15/2022: Calcium vitamin D weightbearing exercise and bisphosphonate therapy.  Sent a prescription for Fosamax.  Return to clinic in 1 year for follow-up

## 2022-11-07 ENCOUNTER — Ambulatory Visit: Payer: Medicare Other | Attending: General Surgery

## 2022-11-07 VITALS — Wt 138.5 lb

## 2022-11-07 DIAGNOSIS — Z483 Aftercare following surgery for neoplasm: Secondary | ICD-10-CM | POA: Insufficient documentation

## 2022-11-07 NOTE — Therapy (Signed)
  OUTPATIENT PHYSICAL THERAPY SOZO SCREENING NOTE   Patient Name: Christine Lozano MRN: [id number] DOB:Sep 01, 1956, 66 y.o., female Today's Date: 11/07/2022  PCP: Billie Ruddy, MD REFERRING PROVIDER: Jovita Kussmaul, MD   PT End of Session - 11/07/22 0840     Visit Number 17   # unchanged due to screen only   PT Start Time 0838    PT Stop Time 0843    PT Time Calculation (min) 5 min    Activity Tolerance Patient tolerated treatment well    Behavior During Therapy [facility] for tasks assessed/performed             Past Medical History:  Diagnosis Date   Facial twitching    Hypertension    Personal history of radiation therapy    Past Surgical History:  Procedure Laterality Date   BREAST BIOPSY Right 08/04/2021   BREAST CYST EXCISION Left [id number]   x2   BREAST LUMPECTOMY     BUNIONECTOMY Left 10 years ago   MASTECTOMY W/ SENTINEL NODE BIOPSY Right 09/27/2021   Procedure: RIGHT MASTECTOMY WITH SENTINEL LYMPH NODE BIOPSY;  Surgeon: Jovita Kussmaul, MD;  Location: [facility];  Service: General;  Laterality: Right;   TOOTH EXTRACTION     Patient Active Problem List   Diagnosis Date Noted   Cancer of right female breast ([place]) 09/27/2021   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 She underwent a right mastectomy and sentinel node biopsy (2 negative nodes) on 09/27/2021 It is ER/PR positive and HER2 negative. She has a history of right breast cancer in 2001 with a lumpectomy, sentinel node biopsy, and radiation.   PRECAUTIONS: right UE Lymphedema risk,  None  SUBJECTIVE: Pt returns for her 3 month L-Dex screen.   PAIN:  Are you having pain? No  SOZO SCREENING: Patient was assessed today using the SOZO machine to determine the lymphedema index score. This was compared to her baseline score. It was determined that she is within the recommended range when compared to her baseline and no further action is needed at this time. She will continue SOZO screenings. These are done every 3 months for 2 years post operatively followed by every 6 months for 2 years, and then annually.   L-DEX FLOWSHEETS - 11/07/22 0800       L-DEX LYMPHEDEMA SCREENING   Measurement Type Unilateral    L-DEX MEASUREMENT EXTREMITY Upper Extremity    POSITION  Standing    DOMINANT SIDE Left    At Risk Side Right    BASELINE SCORE (UNILATERAL) 6    L-DEX SCORE (UNILATERAL) 4.7    VALUE CHANGE (UNILAT) -1.3               Otelia Limes, PTA 11/07/2022, 8:42 AM

## 2022-11-15 ENCOUNTER — Telehealth: Payer: Self-pay | Admitting: Family Medicine

## 2022-11-15 NOTE — Telephone Encounter (Signed)
Left message for patient to call back and schedule Medicare Annual Wellness Visit (AWV) either virtually or in office. Left  my Herbie Drape number [phone]   *due 07/22/22 awvi per [employer]  please schedule with Nurse Health Adviser   45 min for awv-i and in office appointments 30 min for awv-s  phone/virtual appointments

## 2022-11-22 ENCOUNTER — Ambulatory Visit (INDEPENDENT_AMBULATORY_CARE_PROVIDER_SITE_OTHER): Payer: Medicare Other

## 2022-11-22 VITALS — Ht 60.5 in | Wt 140.0 lb

## 2022-11-22 DIAGNOSIS — Z0000 Encounter for general adult medical examination without abnormal findings: Secondary | ICD-10-CM | POA: Diagnosis not present

## 2022-11-22 NOTE — Patient Instructions (Addendum)
Ms. Coonradt , Thank you for taking time to come for your Medicare Wellness Visit. I appreciate your ongoing commitment to your health goals. Please review the following plan we discussed and let me know if I can assist you in the future.   These are the goals we discussed:  Goals       Lose weight (pt-stated)      Stay Healthy!        This is a list of the screening recommended for you and due dates:  Health Maintenance  Topic Date Due   DTaP/Tdap/Td vaccine (1 - Tdap) Never done   COVID-19 Vaccine (3 - Moderna risk series) 12/08/2022*   Flu Shot  02/19/2023*   Zoster (Shingles) Vaccine (1 of 2) 02/21/2023*   Pneumonia Vaccine (1 - PCV) 11/23/2023*   Colon Cancer Screening  11/23/2023*   Hepatitis C Screening: USPSTF Recommendation to screen - Ages 18-79 yo.  11/23/2023*   Medicare Annual Wellness Visit  11/23/2023   Mammogram  08/10/2024   DEXA scan (bone density measurement)  Completed   HPV Vaccine  Aged Out  *Topic was postponed. The date shown is not the original due date.    Advanced directives: Please bring a copy of your health care power of attorney and living will to the office to be added to your chart at your convenience.   Conditions/risks identified: None  Next appointment: Follow up in one year for your annual wellness visit     Preventive Care 65 Years and Older, Female Preventive care refers to lifestyle choices and visits with your health care provider that can promote health and wellness. What does preventive care include? A yearly physical exam. This is also called an annual well check. Dental exams once or twice a year. Routine eye exams. Ask your health care provider how often you should have your eyes checked. Personal lifestyle choices, including: Daily care of your teeth and gums. Regular physical activity. Eating a healthy diet. Avoiding tobacco and drug use. Limiting alcohol use. Practicing safe sex. Taking low-dose aspirin every  day. Taking vitamin and mineral supplements as recommended by your health care provider. What happens during an annual well check? The services and screenings done by your health care provider during your annual well check will depend on your age, overall health, lifestyle risk factors, and family history of disease. Counseling  Your health care provider may ask you questions about your: Alcohol use. Tobacco use. Drug use. Emotional well-being. Home and relationship well-being. Sexual activity. Eating habits. History of falls. Memory and ability to understand (cognition). Work and work Statistician. Reproductive health. Screening  You may have the following tests or measurements: Height, weight, and BMI. Blood pressure. Lipid and cholesterol levels. These may be checked every 5 years, or more frequently if you are over 20 years old. Skin check. Lung cancer screening. You may have this screening every year starting at age 19 if you have a 30-pack-year history of smoking and currently smoke or have quit within the past 15 years. Fecal occult blood test (FOBT) of the stool. You may have this test every year starting at age 103. Flexible sigmoidoscopy or colonoscopy. You may have a sigmoidoscopy every 5 years or a colonoscopy every 10 years starting at age 72. Hepatitis C blood test. Hepatitis B blood test. Sexually transmitted disease (STD) testing. Diabetes screening. This is done by checking your blood sugar (glucose) after you have not eaten for a while (fasting). You may have this done every  1-3 years. Bone density scan. This is done to screen for osteoporosis. You may have this done starting at age 60. Mammogram. This may be done every 1-2 years. Talk to your health care provider about how often you should have regular mammograms. Talk with your health care provider about your test results, treatment options, and if necessary, the need for more tests. Vaccines  Your health care  provider may recommend certain vaccines, such as: Influenza vaccine. This is recommended every year. Tetanus, diphtheria, and acellular pertussis (Tdap, Td) vaccine. You may need a Td booster every 10 years. Zoster vaccine. You may need this after age 79. Pneumococcal 13-valent conjugate (PCV13) vaccine. One dose is recommended after age 77. Pneumococcal polysaccharide (PPSV23) vaccine. One dose is recommended after age 18. Talk to your health care provider about which screenings and vaccines you need and how often you need them. This information is not intended to replace advice given to you by your health care provider. Make sure you discuss any questions you have with your health care provider. Document Released: 12/04/2015 Document Revised: 07/27/2016 Document Reviewed: 09/08/2015 Elsevier Interactive Patient Education  2017 [place] Prevention in the Home Falls can cause injuries. They can happen to people of all ages. There are many things you can do to make your home safe and to help prevent falls. What can I do on the outside of my home? Regularly fix the edges of walkways and driveways and fix any cracks. Remove anything that might make you trip as you walk through a door, such as a raised step or threshold. Trim any bushes or trees on the path to your home. Use bright outdoor lighting. Clear any walking paths of anything that might make someone trip, such as rocks or tools. Regularly check to see if handrails are loose or broken. Make sure that both sides of any steps have handrails. Any raised decks and porches should have guardrails on the edges. Have any leaves, snow, or ice cleared regularly. Use sand or salt on walking paths during winter. Clean up any spills in your garage right away. This includes oil or grease spills. What can I do in the bathroom? Use night lights. Install grab bars by the toilet and in the tub and shower. Do not use towel bars as grab  bars. Use non-skid mats or decals in the tub or shower. If you need to sit down in the shower, use a plastic, non-slip stool. Keep the floor dry. Clean up any water that spills on the floor as soon as it happens. Remove soap buildup in the tub or shower regularly. Attach bath mats securely with double-sided non-slip rug tape. Do not have throw rugs and other things on the floor that can make you trip. What can I do in the bedroom? Use night lights. Make sure that you have a light by your bed that is easy to reach. Do not use any sheets or blankets that are too big for your bed. They should not hang down onto the floor. Have a firm chair that has side arms. You can use this for support while you get dressed. Do not have throw rugs and other things on the floor that can make you trip. What can I do in the kitchen? Clean up any spills right away. Avoid walking on wet floors. Keep items that you use a lot in easy-to-reach places. If you need to reach something above you, use a strong step stool that has a  grab bar. Keep electrical cords out of the way. Do not use floor polish or wax that makes floors slippery. If you must use wax, use non-skid floor wax. Do not have throw rugs and other things on the floor that can make you trip. What can I do with my stairs? Do not leave any items on the stairs. Make sure that there are handrails on both sides of the stairs and use them. Fix handrails that are broken or loose. Make sure that handrails are as long as the stairways. Check any carpeting to make sure that it is firmly attached to the stairs. Fix any carpet that is loose or worn. Avoid having throw rugs at the top or bottom of the stairs. If you do have throw rugs, attach them to the floor with carpet tape. Make sure that you have a light switch at the top of the stairs and the bottom of the stairs. If you do not have them, ask someone to add them for you. What else can I do to help prevent  falls? Wear shoes that: Do not have high heels. Have rubber bottoms. Are comfortable and fit you well. Are closed at the toe. Do not wear sandals. If you use a stepladder: Make sure that it is fully opened. Do not climb a closed stepladder. Make sure that both sides of the stepladder are locked into place. Ask someone to hold it for you, if possible. Clearly mark and make sure that you can see: Any grab bars or handrails. First and last steps. Where the edge of each step is. Use tools that help you move around (mobility aids) if they are needed. These include: Canes. Walkers. Scooters. Crutches. Turn on the lights when you go into a dark area. Replace any light bulbs as soon as they burn out. Set up your furniture so you have a clear path. Avoid moving your furniture around. If any of your floors are uneven, fix them. If there are any pets around you, be aware of where they are. Review your medicines with your doctor. Some medicines can make you feel dizzy. This can increase your chance of falling. Ask your doctor what other things that you can do to help prevent falls. This information is not intended to replace advice given to you by your health care provider. Make sure you discuss any questions you have with your health care provider. Document Released: 09/03/2009 Document Revised: 04/14/2016 Document Reviewed: 12/12/2014 Elsevier Interactive Patient Education  2017 [employer].

## 2022-11-22 NOTE — Progress Notes (Signed)
Subjective:   Christine Lozano is a 67 y.o. female who presents for Medicare Annual (Subsequent) preventive examination.  Review of Systems    Virtual Visit via Telephone Note  I connected with  Christine Lozano on 11/22/22 at  8:45 AM EST by telephone and verified that I am speaking with the correct person using two identifiers.  Location: Patient: Home Provider: Office Persons participating in the virtual visit: patient/Nurse Health Advisor   I discussed the limitations, risks, security and privacy concerns of performing an evaluation and management service by telephone and the availability of in person appointments. The patient expressed understanding and agreed to proceed.  Interactive audio and video telecommunications were attempted between this nurse and patient, however failed, due to patient having technical difficulties OR patient did not have access to video capability.  We continued and completed visit with audio only.  Some vital signs may be absent or patient reported.   Criselda Peaches, LPN  Cardiac Risk Factors include: advanced age (>58mn, >>86women);hypertension     Objective:    Today's Vitals   11/22/22 0850  Weight: 140 lb (63.5 kg)  Height: 5' 0.5" (1.537 m)   Body mass index is 26.89 kg/m.     11/22/2022    8:55 AM 10/11/2022    3:09 PM 08/11/2022    8:49 AM 09/23/2021    3:13 PM 08/11/2021    6:43 PM 06/07/2017    8:37 AM  Advanced Directives  Does Patient Have a Medical Advance Directive? Yes No No No Yes No  [place] of [place]  Does patient want to make changes to medical advance directive?     No - Patient declined   Copy of [place] Chart? No - copy requested    No - copy requested   Would patient like information on creating a medical advance directive?   No - Patient declined No - Patient declined      Current Medications  (verified) Outpatient Encounter Medications as of 11/22/2022  Medication Sig   alendronate (FOSAMAX) 70 MG tablet Take 1 tablet (70 mg total) by mouth once a week. Take with a full glass of water on an empty stomach.   amLODipine (NORVASC) 5 MG tablet Take 5 mg by mouth daily.   anastrozole (ARIMIDEX) 1 MG tablet Take 1 tablet (1 mg total) by mouth daily.   Calcium Carb-Cholecalciferol (CALCIUM 1000 + D PO) Take 2 tablets by mouth in the morning and at bedtime.   Cholecalciferol (VITAMIN D3) 10 MCG/ML LIQD Take 2,000 Units by mouth daily.   incobotulinumtoxinA (XEOMIN) 50 units SOLR injection Inject 50 Units into the muscle every 3 (three) months.   Turmeric 500 MG CAPS Take 1 capsule by mouth daily.   Facility-Administered Encounter Medications as of 11/22/2022  Medication   incobotulinumtoxinA (XEOMIN) 50 units injection 50 Units    Allergies (verified) Patient has no known allergies.   History: Past Medical History:  Diagnosis Date   Facial twitching    Hypertension    Personal history of radiation therapy    Past Surgical History:  Procedure Laterality Date   BREAST BIOPSY Right 08/04/2021   BREAST CYST EXCISION Left 18099,[id number]  x2   BREAST LUMPECTOMY     BUNIONECTOMY Left 10 years ago   MASTECTOMY W/ SENTINEL NODE BIOPSY Right 09/27/2021   Procedure: RIGHT MASTECTOMY WITH SENTINEL LYMPH NODE BIOPSY;  Surgeon: Jovita Kussmaul, MD;  Location: [facility] OR;  Service: General;  Laterality: Right;   TOOTH EXTRACTION     Family History  Problem Relation Age of Onset   Cancer Mother    Diabetes Father    Kidney disease Brother    Diabetes Brother    Colon cancer Neg Hx    Social History   Socioeconomic History   Marital status: Married    Spouse name: Not on file   Number of children: 2   Years of education: college   Highest education level: Master's degree (e.g., MA, MS, MEng, MEd, MSW, MBA)  Occupational History   Occupation: Retired  Tobacco Use   Smoking status: Never    Smokeless tobacco: Never  Vaping Use   Vaping Use: Never used  Substance and Sexual Activity   Alcohol use: No   Drug use: No   Sexual activity: Not on file  Other Topics Concern   Not on file  Social History Narrative   Lives at home with her husband.   Left-handed.   No daily caffeine use.   Social Determinants of Health   Financial Resource Strain: Low Risk  (11/22/2022)   Overall Financial Resource Strain (CARDIA)    Difficulty of Paying Living Expenses: Not hard at all  Food Insecurity: No Food Insecurity (12/13/2021)   Hunger Vital Sign    Worried About Running Out of Food in the Last Year: Never true    Ran Out of Food in the Last Year: Never true  Transportation Needs: No Transportation Needs (12/13/2021)   PRAPARE - Hydrologist (Medical): No    Lack of Transportation (Non-Medical): No  Physical Activity: Sufficiently Active (11/22/2022)   Exercise Vital Sign    Days of Exercise per Week: 5 days    Minutes of Exercise per Session: 60 min  Stress: No Stress Concern Present (11/22/2022)   [place]    Feeling of Stress : Not at all  Social Connections: [place] (11/22/2022)   Social Connection and Isolation Panel [NHANES]    Frequency of Communication with Friends and Family: More than three times a week    Frequency of Social Gatherings with Friends and Family: More than three times a week    Attends Religious Services: More than 4 times per year    Active Member of [employer] or Organizations: Yes    Attends Music therapist: More than 4 times per year    Marital Status: Married    Tobacco Counseling Counseling given: Not Answered   Clinical Intake:  Pre-visit preparation completed: No  Pain : No/denies pain     BMI - recorded: 23.8 Nutritional Status: BMI of 19-24  Normal Nutritional Risks: None Diabetes: No  How often do you need to have  someone help you when you read instructions, pamphlets, or other written materials from your doctor or pharmacy?: 1 - Never  Diabetic?  No  Interpreter Needed?: No  Information entered by :: Rolene Arbour LPN   Activities of Daily Living    11/22/2022    8:54 AM  In your present state of health, do you have any difficulty performing the following activities:  Hearing? 0  Vision? 0  Difficulty concentrating or making decisions? 0  Walking or climbing stairs? 0  Dressing or bathing? 0  Doing errands, shopping? 0  Preparing Food and eating ? N  Using the Toilet? N  In  the past six months, have you accidently leaked urine? N  Do you have problems with loss of bowel control? N  Managing your Medications? N  Managing your Finances? N  Housekeeping or managing your Housekeeping? N    Patient Care Team: Billie Ruddy, MD as PCP - General (Family Medicine) Calvert Cantor, MD as Consulting Physician (Ophthalmology) Jovita Kussmaul, MD as Consulting Physician (General Surgery) Nicholas Lose, MD as Consulting Physician (Hematology and Oncology) Eppie Gibson, MD as Attending Physician (Radiation Oncology) Delice Bison Charlestine Massed, NP as Nurse Practitioner (Hematology and Oncology) Harmon Pier, RN as Registered Nurse  Indicate any recent Medical Services you may have received from other than [facility] providers in the past year (date may be approximate).     Assessment:   This is a routine wellness examination for Orli.  Hearing/Vision screen Hearing Screening - Comments:: Denies hearing difficulties   Vision Screening - Comments:: Wears rx glasses - up to date with routine eye exams with  Dr Phineas Douglas  Dietary issues and exercise activities discussed: Exercise limited by: None identified   Goals Addressed               This Visit's Progress     Lose weight (pt-stated)        Stay Healthy!       Depression Screen    11/22/2022    8:54 AM 12/13/2021   10:54 AM  PHQ  2/9 Scores  PHQ - 2 Score 0 0    Fall Risk    11/22/2022    8:55 AM  [place] in the past year? 0  Number falls in past yr: 0  Injury with Fall? 0  Risk for fall due to : No Fall Risks  Follow up Falls prevention discussed    [place]:  Any stairs in or around the home? Yes  If so, are there any without handrails? No  Home free of loose throw rugs in walkways, pet beds, electrical cords, etc? Yes  Adequate lighting in your home to reduce risk of falls? Yes   ASSISTIVE DEVICES UTILIZED TO PREVENT FALLS:  Life alert? No  Use of a cane, walker or w/c? No  Grab bars in the bathroom? No  Shower chair or bench in shower? Yes  Elevated toilet seat or a handicapped toilet? No   TIMED UP AND GO:  Was the test performed? No . Audio Visit    Cognitive Function:        11/22/2022    8:56 AM  6CIT Screen  What Year? 0 points  What month? 0 points  What time? 0 points  Count back from 20 0 points  Months in reverse 0 points  Repeat phrase 0 points  Total Score 0 points    Immunizations Immunization History  Administered Date(s) Administered   Moderna Sars-Covid-2 Vaccination 01/13/2020, 02/11/2020    TDAP status: Due, Education has been provided regarding the importance of this vaccine. Advised may receive this vaccine at local pharmacy or Health Dept. Aware to provide a copy of the vaccination record if obtained from local pharmacy or Health Dept. Verbalized acceptance and understanding.  Flu Vaccine status: Declined, Education has been provided regarding the importance of this vaccine but patient still declined. Advised may receive this vaccine at local pharmacy or Health Dept. Aware to provide a copy of the vaccination record if obtained from local pharmacy or Health Dept. Verbalized acceptance and understanding.  Pneumococcal vaccine status: Due, Education has been provided regarding the importance of this vaccine. Advised  may receive this vaccine at local pharmacy or Health Dept. Aware to provide a copy of the vaccination record if obtained from local pharmacy or Health Dept. Verbalized acceptance and understanding.  Covid-19 vaccine status: Completed vaccines  Qualifies for Shingles Vaccine? Yes   Zostavax completed No   Shingrix Completed?: No.    Education has been provided regarding the importance of this vaccine. Patient has been advised to call insurance company to determine out of pocket expense if they have not yet received this vaccine. Advised may also receive vaccine at local pharmacy or Health Dept. Verbalized acceptance and understanding.  Screening Tests Health Maintenance  Topic Date Due   DTaP/Tdap/Td (1 - Tdap) Never done   COVID-19 Vaccine (3 - Moderna risk series) 12/08/2022 (Originally 03/10/2020)   INFLUENZA VACCINE  02/19/2023 (Originally 06/21/2022)   Zoster Vaccines- Shingrix (1 of 2) 02/21/2023 (Originally 12/30/1974)   Pneumonia Vaccine 30+ Years old (1 - PCV) 11/23/2023 (Originally 12/30/2020)   COLONOSCOPY (Pts 45-70yr Insurance coverage will need to be confirmed)  11/23/2023 (Originally 06/21/2020)   Hepatitis C Screening  11/23/2023 (Originally 12/30/1973)   Medicare Annual Wellness (AWV)  11/23/2023   MAMMOGRAM  08/10/2024   DEXA SCAN  Completed   HPV VACCINES  Aged Out    Health Maintenance  Health Maintenance Due  Topic Date Due   DTaP/Tdap/Td (1 - Tdap) Never done    Colorectal cancer screening: Referral to GI placed Deferred. Pt aware the office will call re: appt.  Mammogram status: Completed 08/10/22. Repeat every year  Bone Density status: Completed 08/15/22. Results reflect: Bone density results: OSTEOPOROSIS. Repeat every   years.  Lung Cancer Screening: (Low Dose CT Chest recommended if Age 67-80years, 30 pack-year currently smoking OR have quit w/in 15years.) does not qualify.     Additional Screening:  Hepatitis C Screening: does qualify; Completed  Deferred  Vision Screening: Recommended annual ophthalmology exams for early detection of glaucoma and other disorders of the eye. Is the patient up to date with their annual eye exam?  Yes  Who is the provider or what is the name of the office in which the patient attends annual eye exams? Dr PJerline PainIf pt is not established with a provider, would they like to be referred to a provider to establish care? No .   Dental Screening: Recommended annual dental exams for proper oral hygiene  Community Resource Referral / Chronic Care Management:   CRR required this visit?  No   CCM required this visit?  No      Plan:     I have personally reviewed and noted the following in the patient's chart:   Medical and social history Use of alcohol, tobacco or illicit drugs  Current medications and supplements including opioid prescriptions. Patient is not currently taking opioid prescriptions. Functional ability and status Nutritional status Physical activity Advanced directives List of other physicians Hospitalizations, surgeries, and ER visits in previous 12 months Vitals Screenings to include cognitive, depression, and falls Referrals and appointments  In addition, I have reviewed and discussed with patient certain preventive protocols, quality metrics, and best practice recommendations. A written personalized care plan for preventive services as well as general preventive health recommendations were provided to patient.     BCriselda Peaches LPN   16/12/3760  Nurse Notes:  Patient due Hep-C Screening

## 2023-02-13 ENCOUNTER — Ambulatory Visit: Payer: Medicare Other | Attending: General Surgery

## 2023-02-13 VITALS — Wt 138.1 lb

## 2023-02-13 DIAGNOSIS — Z483 Aftercare following surgery for neoplasm: Secondary | ICD-10-CM | POA: Insufficient documentation

## 2023-02-13 NOTE — Therapy (Signed)
  OUTPATIENT PHYSICAL THERAPY SOZO SCREENING NOTE   Patient Name: Christine Lozano MRN: [id number] DOB:07-04-1956, 67 y.o., female Today's Date: 02/13/2023  PCP: Billie Ruddy, MD REFERRING PROVIDER: Jovita Kussmaul, MD   PT End of Session - 02/13/23 [phone]     Visit Number 17   # unchanged due to screen only   PT Start Time 0837    PT Stop Time 0841    PT Time Calculation (min) 4 min    Activity Tolerance Patient tolerated treatment well    Behavior During Therapy [facility] for tasks assessed/performed             Past Medical History:  Diagnosis Date   Facial twitching    Hypertension    Personal history of radiation therapy    Past Surgical History:  Procedure Laterality Date   BREAST BIOPSY Right 08/04/2021   BREAST CYST EXCISION Left [id number]   x2   BREAST LUMPECTOMY     BUNIONECTOMY Left 10 years ago   MASTECTOMY W/ SENTINEL NODE BIOPSY Right 09/27/2021   Procedure: RIGHT MASTECTOMY WITH SENTINEL LYMPH NODE BIOPSY;  Surgeon: Jovita Kussmaul, MD;  Location: [place];  Service: General;  Laterality: Right;   TOOTH EXTRACTION     Patient Active Problem List   Diagnosis Date Noted   Cancer of right female breast ([place]) 09/27/2021   Malignant neoplasm of lower-inner quadrant of right female breast ([place]) 08/10/2021   Hypertensive disorder 08/09/2021   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 She underwent a right mastectomy and sentinel node biopsy (2 negative nodes) on 09/27/2021 It is ER/PR positive and HER2 negative. She has a history of right breast cancer in 2001 with a lumpectomy, sentinel node biopsy, and radiation.   PRECAUTIONS: right UE Lymphedema risk,  None  SUBJECTIVE: Pt returns for her 3 month L-Dex screen.   PAIN:  Are you having pain? No  SOZO SCREENING: Patient was assessed today using the SOZO machine to determine the lymphedema index score. This was compared to her baseline score. It was determined that she is within the recommended range when compared to her baseline and no further action is needed at this time. She will continue SOZO screenings. These are done every 3 months for 2 years post operatively followed by every 6 months for 2 years, and then annually.   L-DEX FLOWSHEETS - 02/13/23 0800       L-DEX LYMPHEDEMA SCREENING   Measurement Type Unilateral    L-DEX MEASUREMENT EXTREMITY Upper Extremity    POSITION  Standing    DOMINANT SIDE Left    At Risk Side Right    BASELINE SCORE (UNILATERAL) 6    L-DEX SCORE (UNILATERAL) 2.9    VALUE CHANGE (UNILAT) -3.1               Otelia Limes, PTA 02/13/2023, 8:40 AM

## 2023-05-15 ENCOUNTER — Ambulatory Visit: Payer: Medicare Other | Attending: General Surgery

## 2023-05-15 VITALS — Wt 138.0 lb

## 2023-05-15 DIAGNOSIS — Z483 Aftercare following surgery for neoplasm: Secondary | ICD-10-CM | POA: Insufficient documentation

## 2023-05-15 NOTE — Therapy (Signed)
  OUTPATIENT PHYSICAL THERAPY SOZO SCREENING NOTE   Patient Name: Christine Lozano MRN: [id number] DOB:1956/02/22, 68 y.o., female Today's Date: 05/15/2023  PCP: Deeann Saint, MD REFERRING PROVIDER: Griselda Miner, MD   PT End of Session - 05/15/23 0846     Visit Number 17   # unchanged due to screen only   PT Start Time 0844    PT Stop Time 0848    PT Time Calculation (min) 4 min    Activity Tolerance Patient tolerated treatment well    Behavior During Therapy [facility] for tasks assessed/performed             Past Medical History:  Diagnosis Date   Facial twitching    Hypertension    Personal history of radiation therapy    Past Surgical History:  Procedure Laterality Date   BREAST BIOPSY Right 08/04/2021   BREAST CYST EXCISION Left [id number]   x2   BREAST LUMPECTOMY     BUNIONECTOMY Left 10 years ago   MASTECTOMY W/ SENTINEL NODE BIOPSY Right 09/27/2021   Procedure: RIGHT MASTECTOMY WITH SENTINEL LYMPH NODE BIOPSY;  Surgeon: Griselda Miner, MD;  Location: [facility] OR;  Service: General;  Laterality: Right;   TOOTH EXTRACTION     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 She underwent a right mastectomy and sentinel node biopsy (2 negative nodes) on 09/27/2021 It is ER/PR positive and HER2 negative. She has a history of right breast cancer in 2001 with a lumpectomy, sentinel node biopsy, and radiation.   PRECAUTIONS: right UE Lymphedema risk,  None  SUBJECTIVE: Pt returns for her 3 month L-Dex screen.   PAIN:  Are you having pain? No  SOZO SCREENING: Patient was assessed today using the SOZO machine to determine the lymphedema index score. This was compared to her baseline score. It was determined that she is within the recommended range when compared to her baseline and no further action is needed at this time. She will continue SOZO screenings. These are done every 3 months for 2 years post operatively followed by every 6 months for 2 years, and then annually.   L-DEX FLOWSHEETS - 05/15/23 0800       L-DEX LYMPHEDEMA SCREENING   Measurement Type Unilateral    L-DEX MEASUREMENT EXTREMITY Upper Extremity    POSITION  Standing    DOMINANT SIDE Left    At Risk Side Right    BASELINE SCORE (UNILATERAL) 6    L-DEX SCORE (UNILATERAL) 5.2    VALUE CHANGE (UNILAT) -0.8               Hermenia Bers, PTA 05/15/2023, 8:47 AM

## 2023-08-14 ENCOUNTER — Ambulatory Visit: Payer: Medicare Other | Attending: General Surgery

## 2023-08-14 VITALS — Wt 138.4 lb

## 2023-08-14 DIAGNOSIS — Z483 Aftercare following surgery for neoplasm: Secondary | ICD-10-CM | POA: Insufficient documentation

## 2023-08-14 NOTE — Therapy (Signed)
OUTPATIENT PHYSICAL THERAPY SOZO SCREENING NOTE   Patient Name: Christine Lozano MRN: [id number] DOB:1956-06-18, 67 y.o., female Today's Date: 08/14/2023  PCP: Deeann Saint, MD REFERRING PROVIDER: Deeann Saint, MD   PT End of Session - 08/14/23 [phone]     Visit Number 17   # unchanged due to screen only   PT Start Time 0850    PT Stop Time 0854    PT Time Calculation (min) 4 min    Activity Tolerance Patient tolerated treatment well    Behavior During Therapy [facility] for tasks assessed/performed             Past Medical History:  Diagnosis Date   Facial twitching    Hypertension    Personal history of radiation therapy    Past Surgical History:  Procedure Laterality Date   BREAST BIOPSY Right 08/04/2021   BREAST CYST EXCISION Left [id number]   x2   BREAST LUMPECTOMY     BUNIONECTOMY Left 10 years ago   MASTECTOMY W/ SENTINEL NODE BIOPSY Right 09/27/2021   Procedure: RIGHT MASTECTOMY WITH SENTINEL LYMPH NODE BIOPSY;  Surgeon: Griselda Miner, MD;  Location: [facility] OR;  Service: General;  Laterality: Right;   TOOTH EXTRACTION     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 She underwent a right mastectomy and sentinel node biopsy (2 negative nodes) on 09/27/2021 It is ER/PR positive and HER2 negative. She has a history of right breast cancer in 2001 with a lumpectomy, sentinel node biopsy, and radiation.   PRECAUTIONS: right UE Lymphedema risk,  None  SUBJECTIVE: Pt returns for her last 3 month L-Dex screen.   PAIN:  Are you having pain? No  SOZO SCREENING: Patient was assessed today using the SOZO machine to determine the lymphedema index score. This was compared to her baseline score. It was determined that she is within the recommended range when compared to her baseline and no further action is needed at this time. She will continue SOZO screenings. These are done every 3 months for 2 years post operatively followed by every 6 months for 2 years, and then annually.   L-DEX FLOWSHEETS - 08/14/23 0800       L-DEX LYMPHEDEMA SCREENING   Measurement Type Unilateral    L-DEX MEASUREMENT EXTREMITY Upper Extremity    POSITION  Standing    DOMINANT SIDE Left    At Risk Side Right    BASELINE SCORE (UNILATERAL) 6    L-DEX SCORE (UNILATERAL) 4.6    VALUE CHANGE (UNILAT) -1.4            P:Begin 6 month L-Dex screens next.    Hermenia Bers, PTA 08/14/2023, 8:53 AM

## 2023-09-03 ENCOUNTER — Other Ambulatory Visit: Payer: Self-pay | Admitting: Hematology and Oncology

## 2023-09-05 IMAGING — MG MM DIGITAL DIAGNOSTIC UNILAT*L* W/ TOMO W/ CAD
6 series · 6 of 18 positions shown · non-contrast
Comparison: Previous exam(s).

CLINICAL DATA: Recent diagnosis right breast cancer.

EXAM:
DIGITAL DIAGNOSTIC UNILATERAL LEFT MAMMOGRAM WITH TOMOSYNTHESIS AND
CAD
TECHNIQUE: Left digital diagnostic mammography and breast tomosynthesis was
performed. The images were evaluated with computer-aided detection.

[L CC synth-2D]
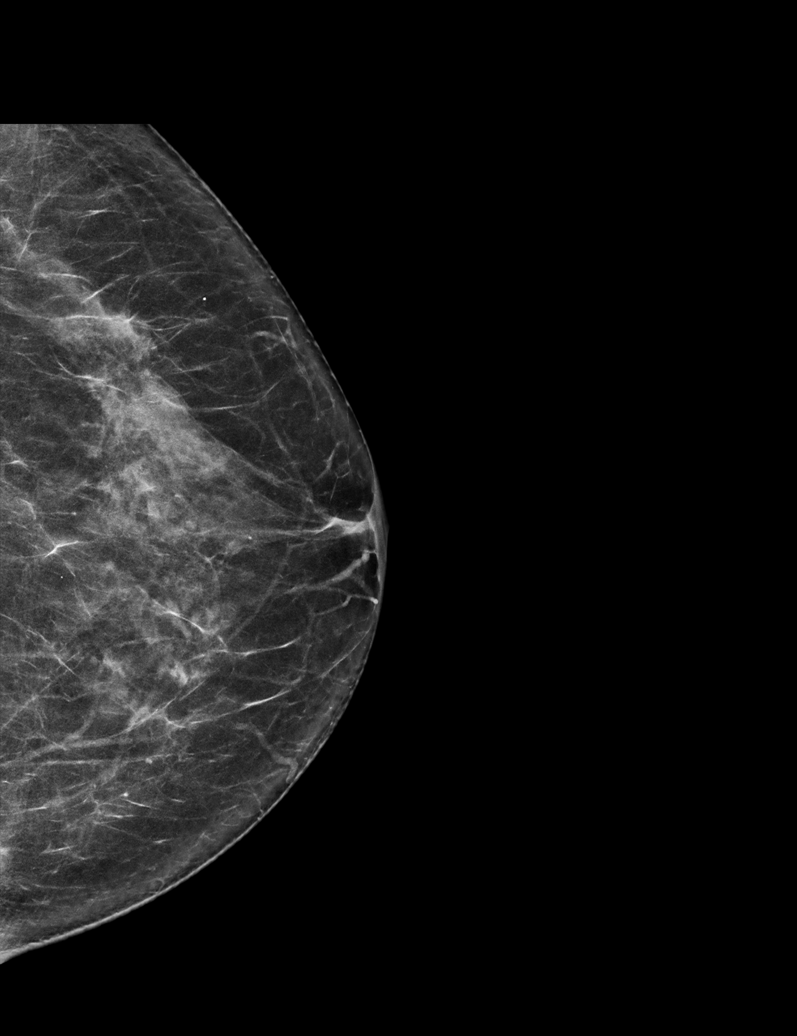

[L MLO synth-2D (1 of 2)]
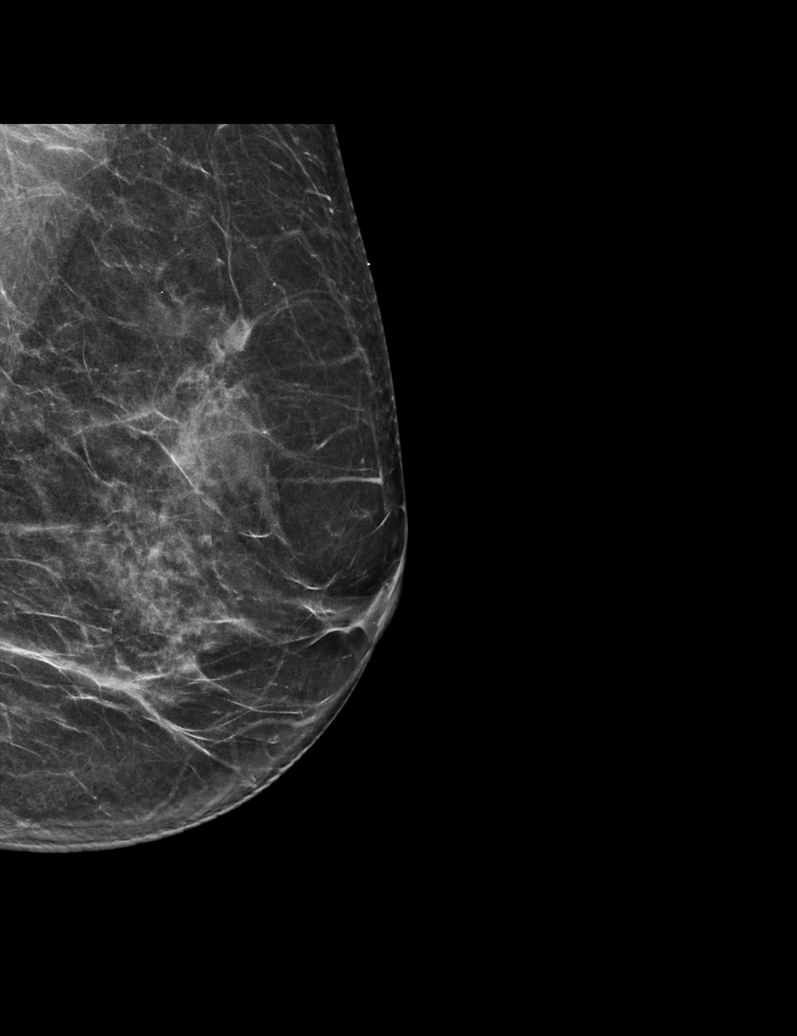

[L MLO synth-2D (2 of 2)]
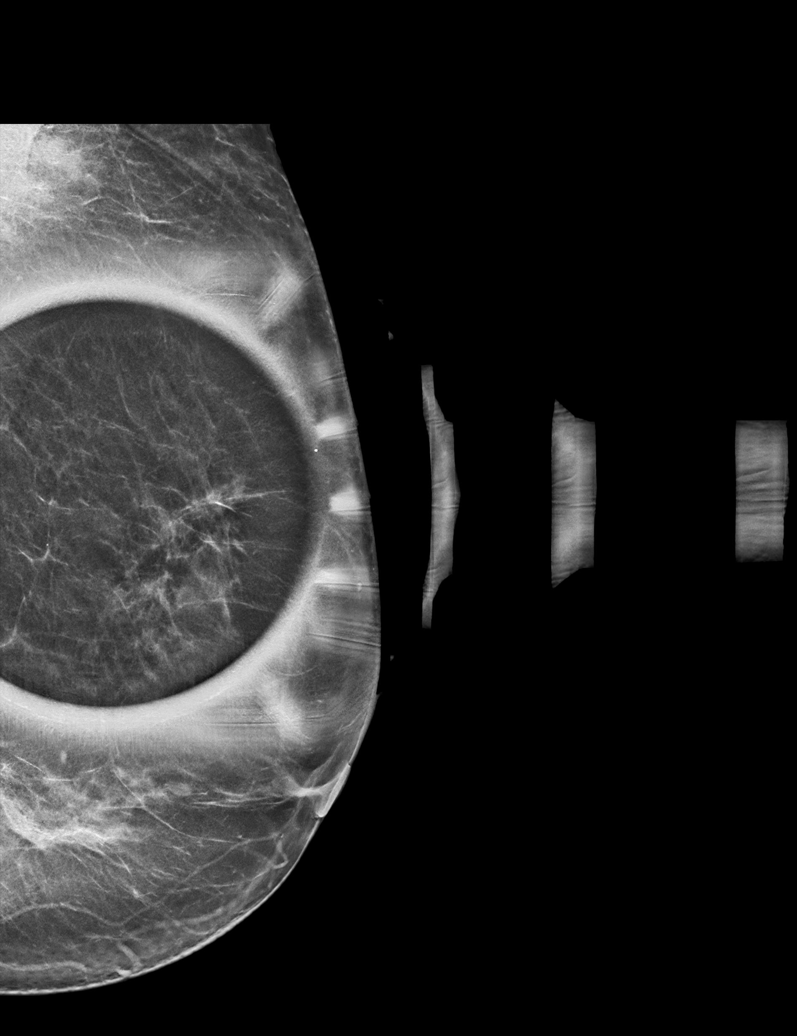

[L MLO tomo (1 of 2) · tomo slice 33/65.0]
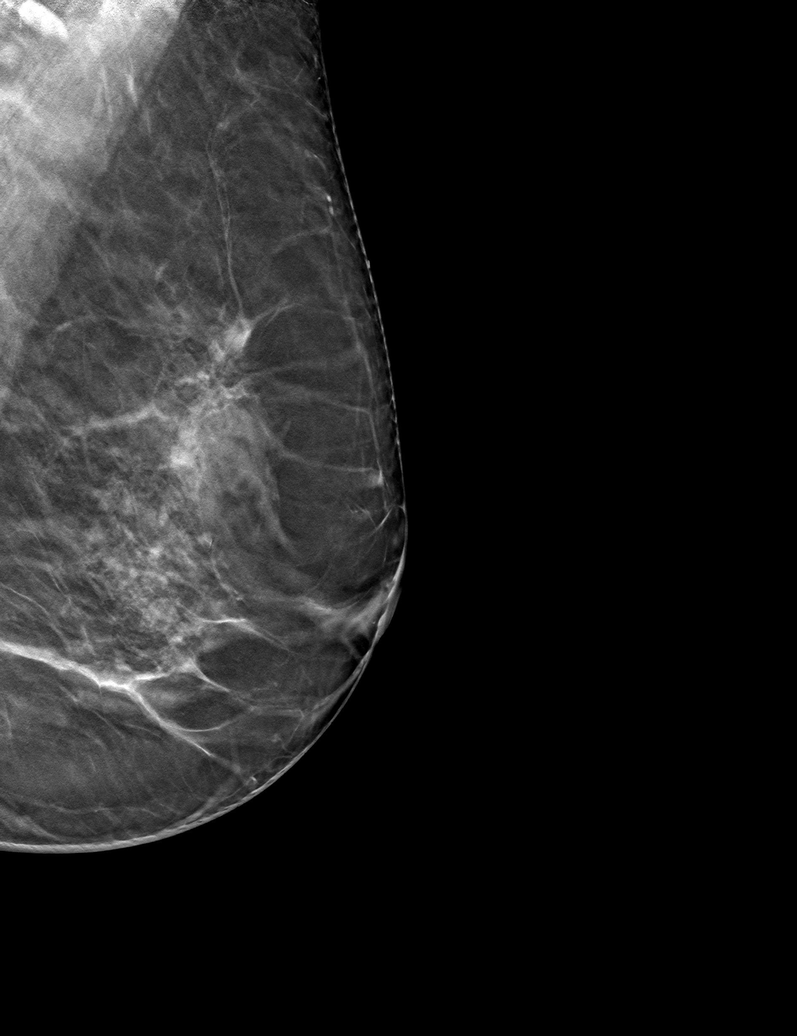

[L CC tomo · tomo slice 33/65.0]
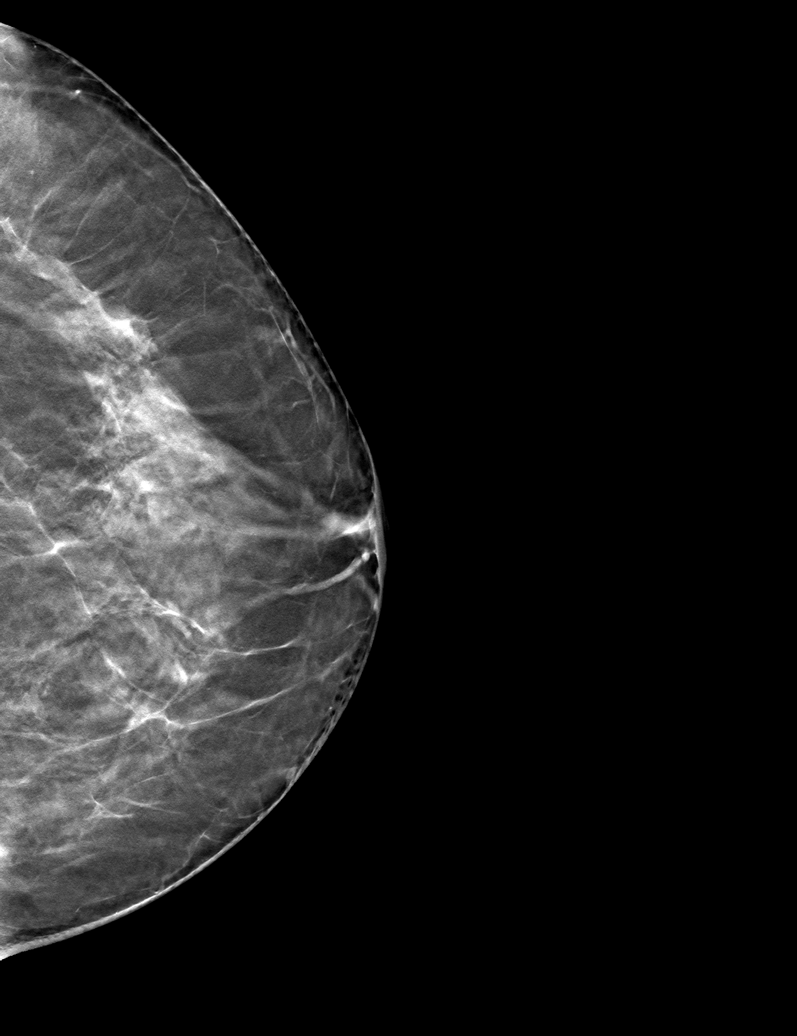

[L MLO tomo (2 of 2) · tomo slice 29/58.0]
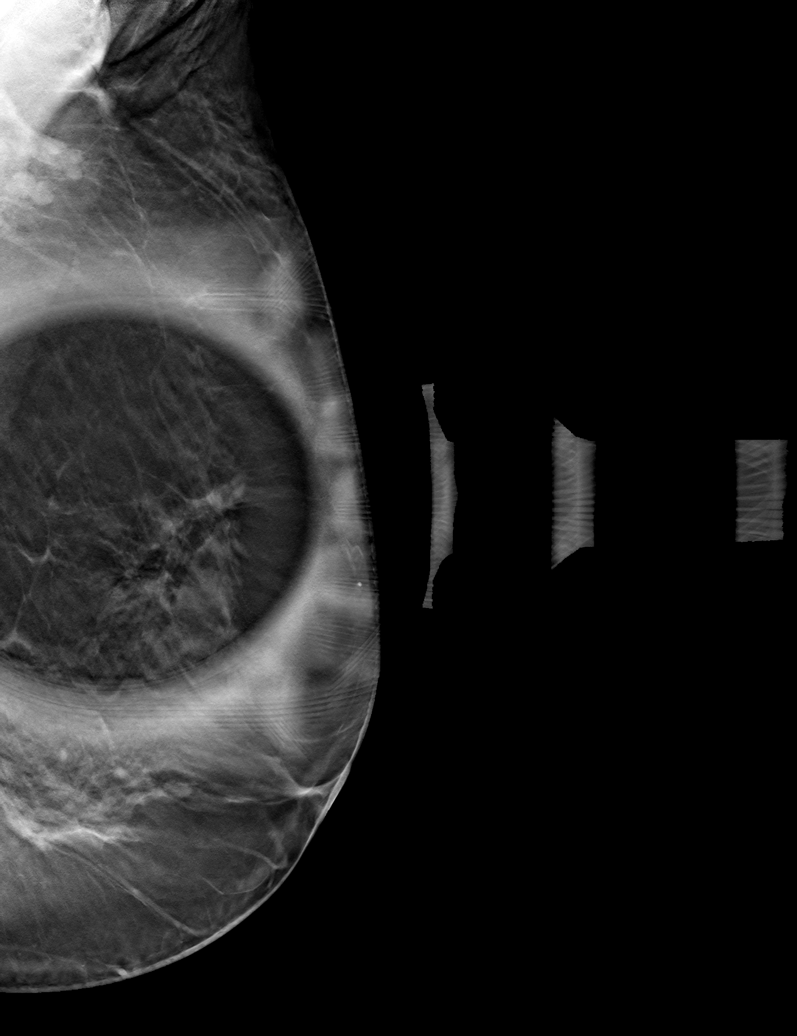

[6 of 18 positions shown; findings below may reference images not displayed]

ACR Breast Density Category b: There are scattered areas of
fibroglandular density.
FINDINGS: Scarring is again seen in the upper-outer left breast. An asymmetry
in the superior left breast resolves on additional imaging. No
suspicious or new masses comparing to multiple previous studies. No
evidence of malignancy on the left.
IMPRESSION: No mammographic evidence of malignancy on the left.

RECOMMENDATION:
Recommend continued surgical and oncologic follow-up for the known
right breast cancer. The patient has a breast MRI pending.

I have discussed the findings and recommendations with the patient.
If applicable, a reminder letter will be sent to the patient
regarding the next appointment.

BI-RADS CATEGORY  2: Benign.

## 2023-10-13 ENCOUNTER — Inpatient Hospital Stay: Payer: Medicare Other | Attending: Hematology and Oncology | Admitting: Hematology and Oncology

## 2023-10-13 VITALS — BP 128/83 | HR 61 | Temp 98.5°F | Resp 18 | Ht 60.5 in | Wt 138.2 lb

## 2023-10-13 DIAGNOSIS — M810 Age-related osteoporosis without current pathological fracture: Secondary | ICD-10-CM | POA: Insufficient documentation

## 2023-10-13 DIAGNOSIS — C50311 Malignant neoplasm of lower-inner quadrant of right female breast: Secondary | ICD-10-CM | POA: Insufficient documentation

## 2023-10-13 DIAGNOSIS — Z79811 Long term (current) use of aromatase inhibitors: Secondary | ICD-10-CM | POA: Diagnosis not present

## 2023-10-13 DIAGNOSIS — Z780 Asymptomatic menopausal state: Secondary | ICD-10-CM

## 2023-10-13 DIAGNOSIS — Z9011 Acquired absence of right breast and nipple: Secondary | ICD-10-CM | POA: Insufficient documentation

## 2023-10-13 DIAGNOSIS — Z170 Estrogen receptor positive status [ER+]: Secondary | ICD-10-CM | POA: Insufficient documentation

## 2023-10-13 MED ORDER — ANASTROZOLE 1 MG PO TABS: 1.0000 mg | ORAL_TABLET | Freq: Every day | ORAL | 3 refills | Status: DC

## 2023-10-13 NOTE — Assessment & Plan Note (Signed)
08/04/2021: Palpable thickening in the right breast retroareolar, ultrasound showed mass at 4:00: 1 cm, axilla negative.  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 Oncotype DX: 17: 5% risk of distant recurrence (no role of chemo) 2. Adjuvant antiestrogen therapy (patient started anastrozole prior to surgery and she will continue with the same) ------------------------------------------------------------------------------------------------------------------------------------- Anastrozole toxicities: Joint stiffness in the hands: I discussed with her about using turmeric to decrease inflammation 2. weight gain: Menopausal symptoms better   Breast cancer surveillance: Mammogram left breast 08/11/2022: Benign breast density category C Breast exam 10/13/2023: Benign   Osteoporosis: Bone density is T score -3.2 on 08/15/2022: Calcium vitamin D weightbearing exercise and bisphosphonate therapy.  Currently on Fosamax.   Return to clinic in 1 year for follow-up

## 2023-10-13 NOTE — Progress Notes (Signed)
Patient Care Team: Deeann Saint, MD as PCP - General (Family Medicine) Nelson Chimes, MD as Consulting Physician (Ophthalmology) Griselda Miner, MD as Consulting Physician (General Surgery) Serena Croissant, MD as Consulting Physician (Hematology and Oncology) Lonie Peak, MD as Attending Physician (Radiation Oncology) Axel Filler Larna Daughters, NP as Nurse Practitioner (Hematology and Oncology) Maryclare Labrador, RN as Registered Nurse  DIAGNOSIS:  Encounter Diagnoses  Name Primary?   Malignant neoplasm of lower-inner quadrant of right breast of female, estrogen receptor positive (HCC) Yes   Post-menopausal     SUMMARY OF ONCOLOGIC HISTORY: Oncology History  Malignant neoplasm of lower-inner quadrant of right female breast (HCC)  08/04/2021 Initial Diagnosis   2001: Lumpectomy and radiation right breast Current: Palpable thickening in the right breast retroareolar, ultrasound showed mass at 4:00: 1 cm, axilla negative.  MRI showed 2.1 cm retroareolar mass plus additional 1.2 cm mass (not biopsied), biopsy revealed grade 2 IDC prognostic panel pending   08/11/2021 Cancer Staging   Staging form: Breast, AJCC 8th Edition - Clinical: Stage IB (cT2, cN0, cM0, G2, ER+, PR+, HER2-) - Signed by Serena Croissant, MD on 08/11/2021 Histologic grading system: 3 grade system   07/2021 - 07/2026 Anti-estrogen oral therapy   Anastrozole 1 mg tablet daily.   09/27/2021 Surgery   Right mastectomy: Grade 2 IDC 2.1 cm with high-grade DCIS, separate focus of DCIS intermediate grade 1 cm involving CSL, margins negative, 0/2 lymph nodes negative, ER 95%, PR 90%, HER2 negative, Ki-67 10%   09/27/2021 Surgery   Right breast mastectomy with sentinel node biopsy   Cancer of right female breast (HCC)  07/2021 - 07/2026 Anti-estrogen oral therapy   Anastrozole 1 mg tablet daily.   09/27/2021 Initial Diagnosis   Cancer of right female breast (HCC)   09/27/2021 Surgery   Right breast mastectomy with sentinel  node biopsy     CHIEF COMPLIANT: Follow-up on anastrozole therapy, also takes Fosamax for osteoporosis  HISTORY OF PRESENT ILLNESS:   History of Present Illness   The patient, a two-year breast cancer survivor, presents for a routine follow-up. She reports joint discomfort, which she attributes to her current medication, anastrozole. The discomfort was initially severe, causing her to consider discontinuing her phone use due to the tightness in her joints. However, she reports that the discomfort has remained stable and is now manageable. She also takes alendronate once a week, which she tolerates well. She denies any breast pain or discomfort.         ALLERGIES:  has No Known Allergies.  MEDICATIONS:  Current Outpatient Medications  Medication Sig Dispense Refill   alendronate (FOSAMAX) 70 MG tablet TAKE 1 TABLET BY MOUTH ONCE WEEKLY ON EMPTY STOMACH WITH FULL GLASS OF WATER 12 tablet 3   amLODipine (NORVASC) 5 MG tablet Take 5 mg by mouth daily.     anastrozole (ARIMIDEX) 1 MG tablet Take 1 tablet (1 mg total) by mouth daily. 90 tablet 3   Calcium Carb-Cholecalciferol (CALCIUM 1000 + D PO) Take 2 tablets by mouth in the morning and at bedtime.     Cholecalciferol (VITAMIN D3) 10 MCG/ML LIQD Take 2,000 Units by mouth daily.     Turmeric 500 MG CAPS Take 1 capsule by mouth daily.     No current facility-administered medications for this visit.    PHYSICAL EXAMINATION: ECOG PERFORMANCE STATUS: 1 - Symptomatic but completely ambulatory  Vitals:   10/13/23 0814  BP: 128/83  Pulse: 61  Resp: 18  Temp: 98.5 F (  36.9 C)  SpO2: 100%   Filed Weights   10/13/23 0814  Weight: 138 lb 3.2 oz (62.7 kg)    Physical Exam   BREAST: No abnormalities or concerns noted. SKIN: Scar well healed.      (exam performed in the presence of a chaperone)  LABORATORY DATA:  I have reviewed the data as listed    Latest Ref Rng & Units 09/23/2021    3:40 PM 08/11/2021   12:34 PM  CMP   Glucose 70 - 99 mg/dL 295  93   BUN 8 - 23 mg/dL 6  12   Creatinine 6.21 - 1.00 mg/dL 3.08  6.57   Sodium 846 - 145 mmol/L 140  142   Potassium 3.5 - 5.1 mmol/L 3.9  4.1   Chloride 98 - 111 mmol/L 107  106   CO2 22 - 32 mmol/L 29  27   Calcium 8.9 - 10.3 mg/dL 9.0  9.3   Total Protein 6.5 - 8.1 g/dL  7.6   Total Bilirubin 0.3 - 1.2 mg/dL  0.5   Alkaline Phos 38 - 126 U/L  87   AST 15 - 41 U/L  20   ALT 0 - 44 U/L  13     Lab Results  Component Value Date   WBC 5.0 09/23/2021   HGB 12.3 09/23/2021   HCT 38.5 09/23/2021   MCV 86.3 09/23/2021   PLT 197 09/23/2021   NEUTROABS 1.7 08/11/2021    ASSESSMENT & PLAN:  Malignant neoplasm of lower-inner quadrant of right female breast (HCC) 08/04/2021: Palpable thickening in the right breast retroareolar, ultrasound showed mass at 4:00: 1 cm, axilla negative.  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 Oncotype DX: 17: 5% risk of distant recurrence (no role of chemo) 2. Adjuvant antiestrogen therapy (patient started anastrozole prior to surgery and she will continue with the same) ------------------------------------------------------------------------------------------------------------------------------------- Anastrozole toxicities: Joint stiffness in the hands: I discussed with her about using turmeric to decrease inflammation 2. weight gain: Menopausal symptoms better   Breast cancer surveillance: Mammogram left breast 08/11/2022: Benign breast density category C Breast exam 10/13/2023: Benign   Osteoporosis: Bone density is T score -3.2 on 08/15/2022: Calcium vitamin D weightbearing  exercise and bisphosphonate therapy.  Currently on Fosamax.   Return to clinic in 1 year for follow-up    Orders Placed This Encounter  Procedures   DG Bone Density    Standing Status:   Future    Standing Expiration Date:   10/12/2024    Order Specific Question:   Reason for Exam (SYMPTOM  OR DIAGNOSIS REQUIRED)    Answer:   post menopausal    Order Specific Question:   Preferred imaging location?    Answer:   [facility]    Order Specific Question:   Release to patient    Answer:   Immediate   The patient has a good understanding of the overall plan. she agrees with it. she will call with any problems that may develop before the next visit here. Total time spent: 30 mins including face to face time and time spent for planning, charting and co-ordination of  care   Tamsen Meek, MD 10/13/23

## 2023-10-26 ENCOUNTER — Other Ambulatory Visit: Payer: Self-pay | Admitting: Obstetrics and Gynecology

## 2023-10-26 DIAGNOSIS — Z1231 Encounter for screening mammogram for malignant neoplasm of breast: Secondary | ICD-10-CM

## 2023-10-27 ENCOUNTER — Ambulatory Visit
Admission: RE | Admit: 2023-10-27 | Discharge: 2023-10-27 | Disposition: A | Discharge status: Home / Self Care | Payer: Medicare Other | Source: Ambulatory Visit | Attending: Obstetrics and Gynecology | Admitting: Obstetrics and Gynecology

## 2023-10-27 DIAGNOSIS — Z1231 Encounter for screening mammogram for malignant neoplasm of breast: Secondary | ICD-10-CM

## 2023-11-01 ENCOUNTER — Other Ambulatory Visit: Payer: Self-pay | Admitting: Obstetrics and Gynecology

## 2023-11-01 DIAGNOSIS — R928 Other abnormal and inconclusive findings on diagnostic imaging of breast: Secondary | ICD-10-CM

## 2023-11-21 ENCOUNTER — Ambulatory Visit: Payer: Medicare Other

## 2023-11-21 ENCOUNTER — Ambulatory Visit
Admission: RE | Admit: 2023-11-21 | Discharge: 2023-11-21 | Disposition: A | Discharge status: Home / Self Care | Payer: Medicare Other | Source: Ambulatory Visit | Attending: Obstetrics and Gynecology | Admitting: Obstetrics and Gynecology

## 2023-11-21 DIAGNOSIS — R928 Other abnormal and inconclusive findings on diagnostic imaging of breast: Secondary | ICD-10-CM

## 2023-11-29 ENCOUNTER — Other Ambulatory Visit: Payer: Self-pay | Admitting: *Deleted

## 2023-11-29 MED ORDER — ALENDRONATE SODIUM 70 MG PO TABS: 70.0000 mg | ORAL_TABLET | ORAL | 3 refills | Status: DC

## 2024-01-09 ENCOUNTER — Ambulatory Visit (INDEPENDENT_AMBULATORY_CARE_PROVIDER_SITE_OTHER): Payer: Medicare Other | Admitting: Family Medicine

## 2024-01-09 DIAGNOSIS — Z0000 Encounter for general adult medical examination without abnormal findings: Secondary | ICD-10-CM

## 2024-01-09 NOTE — Progress Notes (Signed)
PATIENT CHECK-IN and HEALTH RISK ASSESSMENT QUESTIONNAIRE:  -completed by phone/video for upcoming Medicare Preventive Visit  Pre-Visit Check-in: 1)Vitals (height, wt, BP, etc) - record in vitals section for visit on day of visit Request home vitals (wt, BP, etc.) and enter into vitals, THEN update Vital Signs SmartPhrase below at the top of the HPI. See below.  2)Review and Update Medications, Allergies PMH, Surgeries, Social history in Epic 3)Hospitalizations in the last year with date/reason? n  4)Review and Update Care Team (patient's specialists) in Epic 5) Complete PHQ9 in Epic  6) Complete Fall Screening in Epic 7)Review all Health Maintenance Due and order under PCP if not done.  Medicare Wellness Patient Questionnaire:  Answer theses question about your habits: How often do you have a drink containing alcohol?n Have you ever smoked?never Do you use an illicit drugs?n On average, how many days per week do you engage in moderate to strenuous exercise (like a brisk walk)? Bowls 5 days a week Are you sexually active? Sexually active, no concerns for STIs Typical Diet: fish, does not eat meat much, veggie burger or vegetable lo mein or a salad, protein and veggies  Answer theses question about your everyday activities: Can you perform most household chores?no Are you deaf or have significant trouble hearing? no Do you feel that you have a problem with memory?no Do you feel safe at home?yes Last dentist visit?  Goes on regular basis 8. Do you have any difficulty performing your everyday activities?n Are you having any difficulty walking, taking medications on your own, and or difficulty managing daily home needs?n Do you have difficulty walking or climbing stairs?n Do you have difficulty dressing or bathing?n Do you have difficulty doing errands alone such as visiting a doctor's office or shopping?n Do you currently have any difficulty preparing food and eating?n Do you  currently have any difficulty using the toilet?n Do you have any difficulty managing your finances?n Do you have any difficulties with housekeeping of managing your housekeeping?n   Do you have Advanced Directives in place (Living Will, Healthcare Power or Attorney)? Has HCPOA   Last eye Exam and location? Goes yearly, Dr. Hazle Quant   Do you currently use prescribed or non-prescribed narcotic or opioid pain medications?no  Do you have a history or close family history of breast, ovarian, tubal or peritoneal cancer or a family member with BRCA (breast cancer susceptibility 1 and 2) gene mutations?     ----------------------------------------------------------------------------------------------------------------------------------------------------------------------------------------------------------------------  Because this visit was a virtual/telehealth visit, some criteria may be missing or patient reported. Any vitals not documented were not able to be obtained and vitals that have been documented are patient reported.    MEDICARE ANNUAL PREVENTIVE VISIT WITH PROVIDER: (Welcome to Medicare, initial annual wellness or annual wellness exam)  Virtual Visit via Video Note  I connected with Christine Lozano on 01/09/24 by a video enabled telemedicine application and verified that I am speaking with the correct person using two identifiers.  Location patient: car in [place] Location provider:work or home office Persons participating in the virtual visit: patient, provider  Concerns and/or follow up today: no concerns   See HM section in Epic for other details of completed HM.    ROS: negative for report of fevers, unintentional weight loss, vision changes, vision loss, hearing loss or change, chest pain, sob, hemoptysis, melena, hematochezia, hematuria, falls, bleeding or bruising, thoughts of suicide or self harm, memory loss  Patient-completed extensive health risk assessment -  reviewed and discussed with the  patient: See Health Risk Assessment completed with patient prior to the visit either above or in recent phone note. This was reviewed in detailed with the patient today and appropriate recommendations, orders and referrals were placed as needed per Summary below and patient instructions.   Review of Medical History: -PMH, PSH, Family History and current specialty and care providers reviewed and updated and listed below   Patient Care Team: Deeann Saint, MD as PCP - General (Family Medicine) Nelson Chimes, MD as Consulting Physician (Ophthalmology) Griselda Miner, MD as Consulting Physician (General Surgery) Serena Croissant, MD as Consulting Physician (Hematology and Oncology) Lonie Peak, MD as Attending Physician (Radiation Oncology) Axel Filler, Larna Daughters, NP as Nurse Practitioner (Hematology and Oncology) Maryclare Labrador, RN as Registered Nurse   Past Medical History:  Diagnosis Date   Facial twitching    Hypertension    Personal history of radiation therapy     Past Surgical History:  Procedure Laterality Date   BREAST BIOPSY Right 08/04/2021   BREAST CYST EXCISION Left [id number]   x2   BREAST LUMPECTOMY     BUNIONECTOMY Left 10 years ago   MASTECTOMY W/ SENTINEL NODE BIOPSY Right 09/27/2021   Procedure: RIGHT MASTECTOMY WITH SENTINEL LYMPH NODE BIOPSY;  Surgeon: Griselda Miner, MD;  Location: [facility] OR;  Service: General;  Laterality: Right;   TOOTH EXTRACTION      Social History   Socioeconomic History   Marital status: Married    Spouse name: Not on file   Number of children: 2   Years of education: college   Highest education level: Master's degree (e.g., MA, MS, MEng, MEd, MSW, MBA)  Occupational History   Occupation: Retired  Tobacco Use   Smoking status: Never   Smokeless tobacco: Never  Vaping Use   Vaping status: Never Used  Substance and Sexual Activity   Alcohol use: No   Drug use: No   Sexual activity: Not on file   Other Topics Concern   Not on file  Social History Narrative   Lives at home with her husband.   Left-handed.   No daily caffeine use.   Social Drivers of Corporate investment banker Strain: Low Risk  (11/22/2022)   Overall Financial Resource Strain (CARDIA)    Difficulty of Paying Living Expenses: Not hard at all  Food Insecurity: No Food Insecurity (12/13/2021)   Hunger Vital Sign    Worried About Running Out of Food in the Last Year: Never true    Ran Out of Food in the Last Year: Never true  Transportation Needs: No Transportation Needs (12/13/2021)   PRAPARE - Administrator, Civil Service (Medical): No    Lack of Transportation (Non-Medical): No  Physical Activity: Sufficiently Active (11/22/2022)   Exercise Vital Sign    Days of Exercise per Week: 5 days    Minutes of Exercise per Session: 60 min  Stress: No Stress Concern Present (11/22/2022)   [employer] of Occupational Health - Occupational Stress Questionnaire    Feeling of Stress : Not at all  Social Connections: Socially Integrated (11/22/2022)   Social Connection and Isolation Panel [NHANES]    Frequency of Communication with Friends and Family: More than three times a week    Frequency of Social Gatherings with Friends and Family: More than three times a week    Attends Religious Services: More than 4 times per year    Active Member of Clubs or Organizations: Yes    Attends  Club or Organization Meetings: More than 4 times per year    Marital Status: Married  Catering manager Violence: Not At Risk (12/13/2021)   Humiliation, Afraid, Rape, and Kick questionnaire    Fear of Current or Ex-Partner: No    Emotionally Abused: No    Physically Abused: No    Sexually Abused: No    Family History  Problem Relation Age of Onset   Cancer Mother    Diabetes Father    Kidney disease Brother    Diabetes Brother    Colon cancer Neg Hx     Current Outpatient Medications on File Prior to Visit  Medication  Sig Dispense Refill   alendronate (FOSAMAX) 70 MG tablet Take 1 tablet (70 mg total) by mouth once a week. Take with a full glass of water on an empty stomach. 12 tablet 3   amLODipine (NORVASC) 5 MG tablet Take 5 mg by mouth daily.     anastrozole (ARIMIDEX) 1 MG tablet Take 1 tablet (1 mg total) by mouth daily. 90 tablet 3   Calcium Carb-Cholecalciferol (CALCIUM 1000 + D PO) Take 2 tablets by mouth in the morning and at bedtime.     Cholecalciferol (VITAMIN D3) 10 MCG/ML LIQD Take 2,000 Units by mouth daily.     Turmeric 500 MG CAPS Take 1 capsule by mouth daily.     No current facility-administered medications on file prior to visit.    No Known Allergies     Physical Exam Vitals requested from patient and listed below if patient had equipment and was able to obtain at home for this virtual visit: There were no vitals filed for this visit. Estimated body mass index is 26.55 kg/m as calculated from the following:   Height as of 10/13/23: 5' 0.5" (1.537 m).   Weight as of 10/13/23: 138 lb 3.2 oz (62.7 kg).  EKG (optional): deferred due to virtual visit  GENERAL: alert, oriented, no acute distress detected, full vision exam deferred due to pandemic and/or virtual encounter  HEENT: atraumatic, conjunttiva clear, no obvious abnormalities on inspection of external nose and ears  NECK: normal movements of the head and neck  LUNGS: on inspection no signs of respiratory distress, breathing rate appears normal, no obvious gross SOB, gasping or wheezing  CV: no obvious cyanosis  MS: moves all visible extremities without noticeable abnormality  PSYCH/NEURO: pleasant and cooperative, no obvious depression or anxiety, speech and thought processing grossly intact, Cognitive function grossly intact        01/09/2024    5:08 PM 11/22/2022    8:54 AM 12/13/2021   10:54 AM  Depression screen PHQ 2/9  Decreased Interest 0 0 0  Down, Depressed, Hopeless 0 0 0  PHQ - 2 Score 0 0 0        09/27/2021    7:43 PM 08/11/2022    8:49 AM 10/11/2022    3:00 PM 11/22/2022    8:55 AM 01/09/2024    5:08 PM  Fall Risk  Falls in the past year?    0 0  Was there an injury with Fall?    0 0  Fall Risk Category Calculator    0 0  Fall Risk Category (Retired)    Low   (RETIRED) Patient Fall Risk Level Moderate fall risk Low fall risk Low fall risk Low fall risk   Patient at Risk for Falls Due to    No Fall Risks   Fall risk Follow up    Falls  prevention discussed Falls evaluation completed;Education provided     SUMMARY AND PLAN:  Encounter for Medicare annual wellness exam  Discussed applicable health maintenance/preventive health measures and advised and referred or ordered per patient preferences: -discussed hep c screening and that she can get with labs if wishes to do -discussed vaccines due - most declined, advised if decides to do any to please let us kkow so that we can update her record -she reports she had a colonoscopy last year at an outside facility (due to an issue with reception at former GI office) and reports was told that to colonoscopy was normal so would not need any further. She currently can not recall the name of the doctor/location/facility - but agrees to provide this information to our office so that we can obtain the report and update her records.  -is utd on dexa and next has been ordered by her oncologist Health Maintenance  Topic Date Due   Hepatitis C Screening  Never done   DTaP/Tdap/Td (1 - Tdap) Never done   Zoster Vaccines- Shingrix (1 of 2) Never done   Colonoscopy  06/21/2020   COVID-19 Vaccine (3 - Moderna risk series) 01/25/2024 (Originally 03/10/2020)   INFLUENZA VACCINE  02/19/2024 (Originally 06/22/2023)   Pneumonia Vaccine 80+ Years old (1 of 1 - PCV) 01/08/2025 (Originally 12/30/2020)   Medicare Annual Wellness (AWV)  01/08/2025   MAMMOGRAM  10/26/2025   DEXA SCAN  Completed   HPV VACCINES  Aged [employer] and counseling on the  following was provided based on the above review of health and a plan/checklist for the patient, along with additional information discussed, was provided for the patient in the patient instructions :  -Provided counseling and plan for increased risk of falling if applicable per above screening. Reviewed and demonstrated safe balance exercises that can be done at home to improve balance and discussed exercise guidelines for adults with include balance exercises at least 3 days per week.  -Advised and counseled on a healthy lifestyle - including the importance of a healthy diet, regular physical activity, social connections and stress management. -Reviewed patient's current diet. Advised and counseled on a whole foods based healthy diet. A summary of a healthy diet was provided in the Patient Instructions.  -reviewed patient's current physical activity level and discussed exercise guidelines for adults. Discussed community resources and ideas for safe exercise at home to assist in meeting exercise guideline recommendations in a safe and healthy way.  -Advise yearly dental visits at minimum and regular eye exams   Follow up: see patient instructions     Patient Instructions  I really enjoyed getting to talk with you today! I am available on Tuesdays and Thursdays for virtual visits if you have any questions or concerns, or if I can be of any further assistance.   CHECKLIST FROM ANNUAL WELLNESS VISIT:  -Follow up (please call to schedule if not scheduled after visit):   -yearly for annual wellness visit with primary care office  Here is a list of your preventive care/health maintenance measures and the plan for each if any are due:  PLAN For any measures below that may be due:  -please let us know who did your colonoscopy so that we can request a copy of the report for your medical records -please let us know if you get any vaccines elsewhere so that we can update your record  Health  Maintenance  Topic Date Due   Hepatitis C  Screening  Never done   DTaP/Tdap/Td (1 - Tdap) Never done   Zoster Vaccines- Shingrix (1 of 2) Never done   Colonoscopy  06/21/2020   COVID-19 Vaccine (3 - Moderna risk series) 01/25/2024 (Originally 03/10/2020)   INFLUENZA VACCINE  02/19/2024 (Originally 06/22/2023)   Pneumonia Vaccine 39+ Years old (1 of 1 - PCV) 01/08/2025 (Originally 12/30/2020)   Medicare Annual Wellness (AWV)  01/08/2025   MAMMOGRAM  10/26/2025   DEXA SCAN  Completed   HPV VACCINES  Aged Out    -See a dentist at least yearly  -Get your eyes checked and then per your eye specialist's recommendations  -Other issues addressed today:   -I have included below further information regarding a healthy whole foods based diet, physical activity guidelines for adults, stress management and opportunities for social connections. I hope you find this information useful.   -----------------------------------------------------------------------------------------------------------------------------------------------------------------------------------------------------------------------------------------------------------    NUTRITION: -eat real food: lots of colorful vegetables (half the plate) and fruits -5-7 servings of vegetables and fruits per day (fresh or steamed is best), exp. 2 servings of vegetables with lunch and dinner and 2 servings of fruit per day. Berries and greens such as kale and collards are great choices.  -consume on a regular basis:  fresh fruits, fresh veggies, fish, nuts, seeds, healthy oils (such as olive oil, avocado oil), whole grains (make sure first ingredient on label contains the word "whole"), -can eat small amounts of dairy and lean meat (no larger than the palm of your hand), but avoid processed meats such as ham, bacon, lunch meat, etc. -drink water -try to avoid fast food and pre-packaged foods, processed meat, ultra processed foods (donuts, candy,  etc.) -most experts advise limiting sodium to < 2300mg  per day, should limit further is any chronic conditions such as high blood pressure, heart disease, diabetes, etc. The American Heart Association advised that < 1500mg  is is ideal -try to avoid foods that contain any ingredients with names you do not recognize  -try to avoid foods with added sugar or sweeteners/sweets  -try to avoid sweet drinks -try to avoid white rice, white bread, pasta (unless whole grain)  EXERCISE GUIDELINES FOR ADULTS: -if you wish to increase your physical activity, do so gradually and with the approval of your doctor -STOP and seek medical care immediately if you have any chest pain, chest discomfort or trouble breathing when starting or increasing exercise  -move and stretch your body, legs, feet and arms when sitting for long periods -Physical activity guidelines for optimal health in adults: -get at least 150 minutes per week of moderate exercise (can talk, but not sing); this is about 20-30 minutes of sustained activity 5-7 days per week or two 10-15 minute episodes of sustained activity 5-7 days per week -do some muscle building/resistance training at least 2 days per week  -balance exercises 3+ days per week:   Stand somewhere where you have something sturdy to hold onto if you lose balance.    1) lift up on toes, start with 5x per day and work up to 20x   2) stand and lift on leg straight out to the side so that foot is a few inches of the floor, start with 5x each side and work up to 20x each side   3) stand on one foot, start with 5 seconds each side and work up to 20 seconds on each side  If you need ideas or help with getting more active:  -Silver sneakers https://tools.silversneakers.com  -Walk with  a Doc: http://www.duncan-williams.com/  -try to include resistance (weight lifting/strength building) and balance exercises twice per week: or the following link for  ideas: http://castillo-powell.com/  BuyDucts.dk  STRESS MANAGEMENT: -can try meditating, or just sitting quietly with deep breathing while intentionally relaxing all parts of your body for 5 minutes daily -if you need further help with stress, anxiety or depression please follow up with your primary doctor or contact the wonderful folks at [facility]: [phone]  SOCIAL CONNECTIONS: -options in [place] if you wish to engage in more social and exercise related activities:  -Silver sneakers https://tools.silversneakers.com  -Walk with a Doc: http://www.duncan-williams.com/  -Check out the [facility] Active Adults 50+ section on the [place] [employer] (hiking clubs, book clubs, cards and games, chess, exercise classes, aquatic classes and much more) - see the website for details: https://www.[place].gov/departments/parks-recreation/active-adults50  -YouTube has lots of exercise videos for different ages and abilities as well  -[facility] (a variety of indoor and outdoor inperson activities for adults). [phone]. [address]  -Virtual Online Classes (a variety of topics): see seniorplanet.org or call [phone]  -consider volunteering at a school, hospice center, church, senior center or elsewhere            Terressa Koyanagi, DO

## 2024-01-09 NOTE — Patient Instructions (Signed)
I really enjoyed getting to talk with you today! I am available on Tuesdays and Thursdays for virtual visits if you have any questions or concerns, or if I can be of any further assistance.   CHECKLIST FROM ANNUAL WELLNESS VISIT:  -Follow up (please call to schedule if not scheduled after visit):   -yearly for annual wellness visit with primary care office  Here is a list of your preventive care/health maintenance measures and the plan for each if any are due:  PLAN For any measures below that may be due:  -please let us know who did your colonoscopy so that we can request a copy of the report for your medical records -please let us know if you get any vaccines elsewhere so that we can update your record  Health Maintenance  Topic Date Due   Hepatitis C Screening  Never done   DTaP/Tdap/Td (1 - Tdap) Never done   Zoster Vaccines- Shingrix (1 of 2) Never done   Colonoscopy  06/21/2020   COVID-19 Vaccine (3 - Moderna risk series) 01/25/2024 (Originally 03/10/2020)   INFLUENZA VACCINE  02/19/2024 (Originally 06/22/2023)   Pneumonia Vaccine 68+ Years old (1 of 1 - PCV) 01/08/2025 (Originally 12/30/2020)   Medicare Annual Wellness (AWV)  01/08/2025   MAMMOGRAM  10/26/2025   DEXA SCAN  Completed   HPV VACCINES  Aged Out    -See a dentist at least yearly  -Get your eyes checked and then per your eye specialist's recommendations  -Other issues addressed today:   -I have included below further information regarding a healthy whole foods based diet, physical activity guidelines for adults, stress management and opportunities for social connections. I hope you find this information useful.   -----------------------------------------------------------------------------------------------------------------------------------------------------------------------------------------------------------------------------------------------------------    NUTRITION: -eat real food: lots of colorful  vegetables (half the plate) and fruits -5-7 servings of vegetables and fruits per day (fresh or steamed is best), exp. 2 servings of vegetables with lunch and dinner and 2 servings of fruit per day. Berries and greens such as kale and collards are great choices.  -consume on a regular basis:  fresh fruits, fresh veggies, fish, nuts, seeds, healthy oils (such as olive oil, avocado oil), whole grains (make sure first ingredient on label contains the word "whole"), -can eat small amounts of dairy and lean meat (no larger than the palm of your hand), but avoid processed meats such as ham, bacon, lunch meat, etc. -drink water -try to avoid fast food and pre-packaged foods, processed meat, ultra processed foods (donuts, candy, etc.) -most experts advise limiting sodium to < 2300mg  per day, should limit further is any chronic conditions such as high blood pressure, heart disease, diabetes, etc. The American Heart Association advised that < 1500mg  is is ideal -try to avoid foods that contain any ingredients with names you do not recognize  -try to avoid foods with added sugar or sweeteners/sweets  -try to avoid sweet drinks -try to avoid white rice, white bread, pasta (unless whole grain)  EXERCISE GUIDELINES FOR ADULTS: -if you wish to increase your physical activity, do so gradually and with the approval of your doctor -STOP and seek medical care immediately if you have any chest pain, chest discomfort or trouble breathing when starting or increasing exercise  -move and stretch your body, legs, feet and arms when sitting for long periods -Physical activity guidelines for optimal health in adults: -get at least 150 minutes per week of moderate exercise (can talk, but not sing); this is about 20-30 minutes  of sustained activity 5-7 days per week or two 10-15 minute episodes of sustained activity 5-7 days per week -do some muscle building/resistance training at least 2 days per week  -balance exercises 3+  days per week:   Stand somewhere where you have something sturdy to hold onto if you lose balance.    1) lift up on toes, start with 5x per day and work up to 20x   2) stand and lift on leg straight out to the side so that foot is a few inches of the floor, start with 5x each side and work up to 20x each side   3) stand on one foot, start with 5 seconds each side and work up to 20 seconds on each side  If you need ideas or help with getting more active:  -Silver sneakers https://tools.silversneakers.com  -Walk with a Doc: http://www.duncan-williams.com/  -try to include resistance (weight lifting/strength building) and balance exercises twice per week: or the following link for ideas: http://castillo-powell.com/  BuyDucts.dk  STRESS MANAGEMENT: -can try meditating, or just sitting quietly with deep breathing while intentionally relaxing all parts of your body for 5 minutes daily -if you need further help with stress, anxiety or depression please follow up with your primary doctor or contact the wonderful folks at [facility]: [phone]  SOCIAL CONNECTIONS: -options in [place] if you wish to engage in more social and exercise related activities:  -Silver sneakers https://tools.silversneakers.com  -Walk with a Doc: http://www.duncan-williams.com/  -Check out the [facility] Active Adults 50+ section on the [place] [employer] (hiking clubs, book clubs, cards and games, chess, exercise classes, aquatic classes and much more) - see the website for details: https://www.[place].gov/departments/parks-recreation/active-adults50  -YouTube has lots of exercise videos for different ages and abilities as well  -[facility] (a variety of indoor and outdoor inperson activities for adults). (769)199-5766. [address]  -Virtual Online Classes (a variety of topics):  see seniorplanet.org or call 9846908193  -consider volunteering at a school, hospice center, church, senior center or elsewhere

## 2024-01-29 ENCOUNTER — Ambulatory Visit: Payer: Medicare Other

## 2024-04-22 ENCOUNTER — Ambulatory Visit: Attending: General Surgery

## 2024-04-22 VITALS — Wt 140.0 lb

## 2024-04-22 DIAGNOSIS — Z483 Aftercare following surgery for neoplasm: Secondary | ICD-10-CM | POA: Insufficient documentation

## 2024-04-22 NOTE — Therapy (Signed)
  OUTPATIENT PHYSICAL THERAPY SOZO SCREENING NOTE   Patient Name: Christine Lozano MRN: [id number] DOB:1956-01-05, 68 y.o., female Today's Date: 04/22/2024  PCP: Viola Greulich, MD REFERRING PROVIDER: Caralyn Chandler, MD   PT End of Session - 04/22/24 [id number]     Visit Number 17   # unchanged due to screen only   PT Start Time 0936    PT Stop Time 0940    PT Time Calculation (min) 4 min    Activity Tolerance Patient tolerated treatment well    Behavior During Therapy [facility] for tasks assessed/performed             Past Medical History:  Diagnosis Date   Facial twitching    Hypertension    Personal history of radiation therapy    Past Surgical History:  Procedure Laterality Date   BREAST BIOPSY Right 08/04/2021   BREAST CYST EXCISION Left 1191,4782   x2   BREAST LUMPECTOMY     BUNIONECTOMY Left 10 years ago   MASTECTOMY W/ SENTINEL NODE BIOPSY Right 09/27/2021   Procedure: RIGHT MASTECTOMY WITH SENTINEL LYMPH NODE BIOPSY;  Surgeon: Caralyn Chandler, MD;  Location: [facility] OR;  Service: General;  Laterality: Right;   TOOTH EXTRACTION     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 She underwent a right mastectomy and sentinel node biopsy (2 negative nodes) on 09/27/2021 It is ER/PR positive and HER2 negative. She has a history of right breast cancer in 2001 with a lumpectomy, sentinel node biopsy, and radiation.   PRECAUTIONS: right UE Lymphedema risk,  None  SUBJECTIVE: Pt returns for her first 6 month L-Dex screen.   PAIN:  Are you having pain? No  SOZO SCREENING: Patient was assessed today using the SOZO machine to determine the lymphedema index score. This was compared to her baseline score. It was determined that she is within the recommended range when compared to her baseline and no further action is needed at this time. She will continue SOZO screenings. These are done every 3 months for 2 years post operatively followed by every 6 months for 2 years, and then annually.   L-DEX FLOWSHEETS - 04/22/24 0900       L-DEX LYMPHEDEMA SCREENING   Measurement Type Unilateral    L-DEX MEASUREMENT EXTREMITY Upper Extremity    POSITION  Standing    DOMINANT SIDE Left    At Risk Side Right    BASELINE SCORE (UNILATERAL) 6    L-DEX SCORE (UNILATERAL) 5.2    VALUE CHANGE (UNILAT) -0.8            P: Cont 6 month L-Dex screens until 4 years from breast surgery .    Denyce Flank, PTA 04/22/2024, 9:40 AM

## 2024-05-16 ENCOUNTER — Encounter: Payer: Self-pay | Admitting: Family Medicine

## 2024-05-16 ENCOUNTER — Ambulatory Visit (INDEPENDENT_AMBULATORY_CARE_PROVIDER_SITE_OTHER): Admitting: Family Medicine

## 2024-05-16 VITALS — BP 120/72 | HR 65 | Temp 98.7°F | Ht 60.5 in | Wt 139.0 lb

## 2024-05-16 DIAGNOSIS — B0239 Other herpes zoster eye disease: Secondary | ICD-10-CM | POA: Diagnosis not present

## 2024-05-16 DIAGNOSIS — I10 Essential (primary) hypertension: Secondary | ICD-10-CM

## 2024-05-16 MED ORDER — VALACYCLOVIR HCL 1 G PO TABS: 1000.0000 mg | ORAL_TABLET | Freq: Three times a day (TID) | ORAL | 0 refills | Status: AC

## 2024-05-16 MED ORDER — GABAPENTIN 100 MG PO CAPS: 100.0000 mg | ORAL_CAPSULE | Freq: Three times a day (TID) | ORAL | 0 refills | Status: DC

## 2024-05-16 NOTE — Progress Notes (Signed)
 Established Patient Office Visit   Subjective  Patient ID: Christine Lozano, female    DOB: 1956/08/22  Age: 68 y.o. MRN: [id number]  Chief Complaint  Patient presents with   Medical Management of Chronic Issues    Sores on scalp and fore head around the top of the eye, on the left side , started 3 weeks ago, painful, itchy, red bumps     Patient is a 68 year old female with pmh sig for right breast cancer status postmastectomy on anastrozole , HTN, meige syndrome who was seen for acute concern.  Pt endorses red slightly raised tender bumps in frontal area of scalp x a few weeks.  Patient started noticing similar lesions on forehead and around left eye starting this week.  Areas are pruritic, sore, and have a burning sensation.  Trying not to scratch.  None of the lesions ever became fluid-filled. Denies changes in vision, shampoos, soaps, lotions, or other products.  Had shingles vaccine.  In the past seen at [facility] eye.    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/16/2021   Blepharospasm of both eyes 10/21/2019   Past Medical History:  Diagnosis Date   Facial twitching    Hypertension    Personal history of radiation therapy    Past Surgical History:  Procedure Laterality Date   BREAST BIOPSY Right 08/04/2021   BREAST CYST EXCISION Left 8025,8019   x2   BREAST LUMPECTOMY     BUNIONECTOMY Left 10 years ago   MASTECTOMY W/ SENTINEL NODE BIOPSY Right 09/27/2021   Procedure: RIGHT MASTECTOMY WITH SENTINEL LYMPH NODE BIOPSY;  Surgeon: Curvin Deward MOULD, MD;  Location: [facility] OR;  Service: General;  Laterality: Right;   TOOTH EXTRACTION     Social History   Tobacco Use   Smoking status: Never   Smokeless tobacco: Never  Vaping Use   Vaping status:  Never Used  Substance Use Topics   Alcohol use: No   Drug use: No   Family History  Problem Relation Age of Onset   Cancer Mother    Diabetes Father    Kidney disease Brother    Diabetes Brother    Colon cancer Neg Hx    No Known Allergies  ROS Negative unless stated above    Objective:     BP 120/72 (BP Location: Left Arm, Patient Position: Sitting, Cuff Size: Normal)   Pulse 65   Temp 98.7 F (37.1 C) (Oral)   Ht 5' 0.5 (1.537 m)   Wt 139 lb (63 kg)   SpO2 93%   BMI 26.70 kg/m  BP Readings from Last 3 Encounters:  05/16/24 120/72  10/13/23 128/83  10/11/22 (!) 150/84   Wt Readings from Last 3 Encounters:  05/16/24 139 lb (63 kg)  04/22/24 140 lb (63.5 kg)  10/13/23 138 lb 3.2 oz (62.7 kg)      Physical Exam Constitutional:      Appearance: Normal appearance.  HENT:     Head: Normocephalic and atraumatic.     Nose: Nose normal.     Mouth/Throat:     Mouth: Mucous membranes are moist.   Eyes:     Extraocular Movements: Extraocular movements intact.     Conjunctiva/sclera: Conjunctivae normal.     Pupils: Pupils are equal, round, and  reactive to light.    Cardiovascular:     Rate and Rhythm: Normal rate.  Pulmonary:     Effort: Pulmonary effort is normal.   Skin:    General: Skin is warm and dry.     Findings: Rash present. Rash is papular.         Comments: Patient with 2-3 erythematous papules centrally located within scalp and frontal area of head proximal to the coronal suture with similar lesions on left forehead, left eyebrow, superiorly at bridge of nose, and L upper eyelid.   Neurological:     Mental Status: She is alert and oriented to person, place, and time. Mental status is at baseline.           05/16/2024   12:15 PM 01/09/2024    5:08 PM 11/22/2022    8:54 AM  Depression screen PHQ 2/9  Decreased Interest 0 0 0  Down, Depressed, Hopeless 0 0 0  PHQ - 2 Score 0 0 0  Altered sleeping 0    Tired, decreased energy 0     Change in appetite 0    Feeling bad or failure about yourself  0    Trouble concentrating 0    Moving slowly or fidgety/restless 0    Suicidal thoughts 0    PHQ-9 Score 0        05/16/2024   12:16 PM  GAD 7 : Generalized Anxiety Score  Nervous, Anxious, on Edge 0  Control/stop worrying 0  Worry too much - different things 0  Trouble relaxing 0  Restless 0  Easily annoyed or irritable 0  Afraid - awful might happen 0  Total GAD 7 Score 0     No results found for any visits on 05/16/24.    Assessment & Plan:   Shingles of eyelid -     Ambulatory referral to Ophthalmology -     Gabapentin ; Take 1 capsule (100 mg total) by mouth 3 (three) times daily.  Dispense: 90 capsule; Refill: 0 -     valACYclovir HCl; Take 1 tablet (1,000 mg total) by mouth 3 (three) times daily for 7 days.  Dispense: 21 tablet; Refill: 0  Essential hypertension  Pt with crops of lesions in scalp and left side of face concerning for herpes zoster.  Start Valtrex and gabapentin .  Referral to stat ophthalmology.  Appointment at [facility] eye tomorrow at 915 am.  Given strict precautions.  Per chart review no record of Shingrix vaccine, though patient states she had.  Will see if patient can provide dates to update record.  BP well-controlled.  Continue Norvasc  5 mg daily and lifestyle modifications.  Return if symptoms worsen or fail to improve.   Clotilda JONELLE Single, MD

## 2024-06-12 ENCOUNTER — Other Ambulatory Visit: Payer: Self-pay | Admitting: Family Medicine

## 2024-06-12 DIAGNOSIS — B0239 Other herpes zoster eye disease: Secondary | ICD-10-CM

## 2024-06-27 ENCOUNTER — Telehealth: Payer: Self-pay

## 2024-06-27 NOTE — Telephone Encounter (Signed)
 Left voicemail to call office back to see if referral was completed.   Curtistine Quiet, CMA

## 2024-10-10 ENCOUNTER — Ambulatory Visit: Payer: Self-pay

## 2024-10-10 NOTE — Telephone Encounter (Deleted)
 3rd attempt to reach patient. Left voicemail with office call back number.

## 2024-10-10 NOTE — Telephone Encounter (Signed)
 FYI Only or Action Required?: FYI only for provider: appointment scheduled on 10/11/24.  Patient was last seen in primary care on 05/16/2024 by Mercer Clotilda SAUNDERS, MD.  Called Nurse Triage reporting Chest Pain and Hypertension.  Symptoms began yesterday.  Interventions attempted: Rest, hydration, or home remedies.  Symptoms are: stable.  Triage Disposition: See Physician Within 24 Hours  Patient/caregiver understands and will follow disposition?: Yes Reason for Disposition  [1] Chest pain lasts > 5 minutes AND [2] occurred > 3 days ago (72 hours) AND [3] NO chest pain or cardiac symptoms now  Answer Assessment - Initial Assessment Questions Took BP when feeling that feeling and it was 178/96. Continued to take it and it got lower and patient felt better. States felt it before maybe a year ago, and wasn't sure what it was, felt like indigestion. Denies difficulty breathing  1. LOCATION: Where does it hurt?       Felt like was punched in throat  2. RADIATION: Does the pain go anywhere else? (e.g., into neck, jaw, arms, back)     Denies  3. ONSET: When did the chest pain begin? (Minutes, hours or days)      Midnight last night  4. PATTERN: Does the pain come and go, or has it been constant since it started?  Does it get worse with exertion?      Came and went  5. DURATION: How long does it last (e.g., seconds, minutes, hours)     45 mins -1 hour  6. SEVERITY: How bad is the pain?  (e.g., Scale 1-10; mild, moderate, or severe)     Uncomfortable  7. CARDIAC RISK FACTORS: Do you have any history of heart problems or risk factors for heart disease? (e.g., angina, prior heart attack; diabetes, high blood pressure, high cholesterol, smoker, or strong family history of heart disease)     Hypertension, takes amlodipine . Denies any other  8. PULMONARY RISK FACTORS: Do you have any history of lung disease?  (e.g., blood clots in lung, asthma, emphysema, birth control  pills)     Denies  9. CAUSE: What do you think is causing the chest pain?     Unsure  10. OTHER SYMPTOMS: Do you have any other symptoms? (e.g., dizziness, nausea, vomiting, sweating, fever, difficulty breathing, cough)       Slight nausea at the time if incident.  Protocols used: Chest Pain-A-AH

## 2024-10-10 NOTE — Telephone Encounter (Signed)
 2nd attempt to reach patient. Left voicemail with office call back number.

## 2024-10-10 NOTE — Telephone Encounter (Signed)
 1st attempt to reach patient, left voicemail with office call back number.   Copied from CRM [id number]. Topic: Clinical - Medical Advice >> Oct 10, 2024  9:23 AM Christine Lozano wrote: Reason for CRM: Patient has had some chest pain, but not having them now. Says she had it last night and felt like someone punched her in the throat. Took her BP 178/96. Lasted 45- 1 hour and then went away. Not having any pain this morning and her BP was normal 121/75.  Looking for advice on what she should do and if she should have any testing done.   Patient can be reached at Christine Lozano

## 2024-10-11 ENCOUNTER — Ambulatory Visit (INDEPENDENT_AMBULATORY_CARE_PROVIDER_SITE_OTHER): Admitting: Family Medicine

## 2024-10-11 ENCOUNTER — Encounter: Payer: Self-pay | Admitting: Family Medicine

## 2024-10-11 ENCOUNTER — Ambulatory Visit (INDEPENDENT_AMBULATORY_CARE_PROVIDER_SITE_OTHER)

## 2024-10-11 VITALS — BP 154/76 | HR 67 | Temp 98.5°F | Wt 138.0 lb

## 2024-10-11 DIAGNOSIS — R079 Chest pain, unspecified: Secondary | ICD-10-CM | POA: Diagnosis not present

## 2024-10-11 DIAGNOSIS — Z923 Personal history of irradiation: Secondary | ICD-10-CM

## 2024-10-11 DIAGNOSIS — C50311 Malignant neoplasm of lower-inner quadrant of right female breast: Secondary | ICD-10-CM | POA: Diagnosis not present

## 2024-10-11 DIAGNOSIS — I10 Essential (primary) hypertension: Secondary | ICD-10-CM | POA: Diagnosis not present

## 2024-10-11 DIAGNOSIS — Z170 Estrogen receptor positive status [ER+]: Secondary | ICD-10-CM

## 2024-10-11 LAB — COMPREHENSIVE METABOLIC PANEL WITH GFR
ALT: 11 U/L (ref 0–35)
AST: 17 U/L (ref 0–37)
Albumin: 4.3 g/dL (ref 3.5–5.2)
Alkaline Phosphatase: 71 U/L (ref 39–117)
BUN: 13 mg/dL (ref 6–23)
CO2: 30 meq/L (ref 19–32)
Calcium: 9.4 mg/dL (ref 8.4–10.5)
Chloride: 101 meq/L (ref 96–112)
Creatinine, Ser: 0.86 mg/dL (ref 0.40–1.20)
GFR: 69.24 mL/min (ref >60.00)
Glucose, Bld: 85 mg/dL (ref 70–99)
Potassium: 4.2 meq/L (ref 3.5–5.1)
Sodium: 137 meq/L (ref 135–145)
Total Bilirubin: 0.6 mg/dL (ref 0.2–1.2)
Total Protein: 7.4 g/dL (ref 6.0–8.3)

## 2024-10-11 LAB — T4, FREE: Free T4: 0.68 ng/dL (ref 0.60–1.60)

## 2024-10-11 LAB — CBC WITH DIFFERENTIAL/PLATELET
Basophils Absolute: 0 K/uL (ref 0.0–0.1)
Basophils Relative: 0.6 % (ref 0.0–3.0)
Eosinophils Absolute: 0.1 K/uL (ref 0.0–0.7)
Eosinophils Relative: 2.3 % (ref 0.0–5.0)
HCT: 37.5 % (ref 36.0–46.0)
Hemoglobin: 12.3 g/dL (ref 12.0–15.0)
Lymphocytes Relative: 43.4 % (ref 12.0–46.0)
Lymphs Abs: 1.7 K/uL (ref 0.7–4.0)
MCHC: 32.8 g/dL (ref 30.0–36.0)
MCV: 83.4 fl (ref 78.0–100.0)
Monocytes Absolute: 0.3 K/uL (ref 0.1–1.0)
Monocytes Relative: 8.1 % (ref 3.0–12.0)
Neutro Abs: 1.8 K/uL (ref 1.4–7.7)
Neutrophils Relative %: 45.6 % (ref 43.0–77.0)
Platelets: 214 K/uL (ref 150.0–400.0)
RBC: 4.5 Mil/uL (ref 3.87–5.11)
RDW: 14.3 % (ref 11.5–15.5)
WBC: 3.9 K/uL — ABNORMAL LOW (ref 4.0–10.5)

## 2024-10-11 LAB — LIPID PANEL
Cholesterol: 169 mg/dL (ref 0–200)
HDL: 52.9 mg/dL (ref >39.00)
LDL Cholesterol: 105 mg/dL — ABNORMAL HIGH (ref 0–99)
NonHDL: 115.84
Total CHOL/HDL Ratio: 3
Triglycerides: 54 mg/dL (ref 0.0–149.0)
VLDL: 10.8 mg/dL (ref 0.0–40.0)

## 2024-10-11 LAB — TSH: TSH: 0.57 u[IU]/mL (ref 0.35–5.50)

## 2024-10-11 LAB — MAGNESIUM: Magnesium: 2 mg/dL (ref 1.5–2.5)

## 2024-10-11 NOTE — Progress Notes (Signed)
 Established Patient Office Visit   Subjective  Patient ID: Christine Lozano, female    DOB: 1956/08/06  Age: 68 y.o. MRN: [id number]  Chief Complaint  Patient presents with   Acute Visit    Patient came in today for chest pain that started 11/19, patient's Bp was also elevated 172/95 -141/88,     Pt is a 68 yo female with pmh sig for HTN, R breast cancer s/p XRT on anastrozole , blepharospasms who was seen for acute concern.  Episode of pain in mid chest 2 nights ago while laying in bed. Sensation is difficult to describe.  Noted as a constant pain for 1 hour with brief nausea.  Pressing on her chest did not help.  Felt like was punched in throat.  Unsure if sensation felt like a burning.  Did not have dinner that night as she ate around 3 or 4 pm and was not hungry.  Denies overt heart burn.  BP was elevated 172/95 at time of symptoms.  Denies increased sodium intake.  Did not take anything for sx.  Was going to drive to ED but changed mind, decided to wait it out. Taking norvasc  consistently.  Pt treated with radiation for R breast cancer in the late 1990s? States went 5x/wk for a month.    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/16/2021   Blepharospasm of both eyes 10/21/2019   Past Medical History:  Diagnosis Date   Facial twitching    Hypertension    Personal history of radiation therapy    Past Surgical History:  Procedure Laterality Date   BREAST BIOPSY Right 08/04/2021   BREAST CYST EXCISION Left 8025,8019   x2   BREAST LUMPECTOMY     BUNIONECTOMY Left 10 years ago   MASTECTOMY W/ SENTINEL NODE BIOPSY Right 09/27/2021   Procedure: RIGHT MASTECTOMY WITH SENTINEL LYMPH NODE BIOPSY;  Surgeon: Curvin Deward MOULD, MD;  Location: [facility] OR;   Service: General;  Laterality: Right;   TOOTH EXTRACTION     Social History   Tobacco Use   Smoking status: Never   Smokeless tobacco: Never  Vaping Use   Vaping status: Never Used  Substance Use Topics   Alcohol use: No   Drug use: No   Family History  Problem Relation Age of Onset   Cancer Mother    Diabetes Father    Kidney disease Brother    Diabetes Brother    Colon cancer Neg Hx    No Known Allergies  ROS Negative unless stated above    Objective:     BP (!) 154/76 (BP Location: Left Arm, Patient Position: Sitting, Cuff Size: Large)   Pulse 67   Temp 98.5 F (36.9 C) (Oral)   Wt 138 lb (62.6 kg)   SpO2 98%   BMI 26.51 kg/m  BP Readings from Last 3 Encounters:  10/11/24 (!) 154/76  05/16/24 120/72  10/13/23 128/83   Wt Readings from Last 3 Encounters:  10/11/24 138 lb (62.6 kg)  05/16/24 139 lb (63 kg)  04/22/24 140 lb (63.5 kg)      Physical Exam Constitutional:      General: She is not in acute distress.    Appearance: Normal appearance.  HENT:     Head: Normocephalic and atraumatic.  Nose: Nose normal.     Mouth/Throat:     Mouth: Mucous membranes are moist.  Cardiovascular:     Rate and Rhythm: Normal rate and regular rhythm.     Chest Wall: No thrill.     Heart sounds: Normal heart sounds. No murmur heard.    No gallop.  Pulmonary:     Effort: Pulmonary effort is normal. No respiratory distress.     Breath sounds: Normal breath sounds. No wheezing, rhonchi or rales.  Musculoskeletal:     Right lower leg: No edema.     Left lower leg: No edema.  Skin:    General: Skin is warm and dry.  Neurological:     Mental Status: She is alert and oriented to person, place, and time.        10/11/2024   11:53 AM 05/16/2024   12:15 PM 01/09/2024    5:08 PM  Depression screen PHQ 2/9  Decreased Interest 0 0 0  Down, Depressed, Hopeless 0 0 0  PHQ - 2 Score 0 0 0  Altered sleeping 0 0   Tired, decreased energy 0 0   Change in appetite  0 0   Feeling bad or failure about yourself  0 0   Trouble concentrating 0 0   Moving slowly or fidgety/restless 0 0   Suicidal thoughts 0 0   PHQ-9 Score 0 0       Data saved with a previous flowsheet row definition      10/11/2024   11:53 AM 05/16/2024   12:16 PM  GAD 7 : Generalized Anxiety Score  Nervous, Anxious, on Edge 0 0  Control/stop worrying 0 0  Worry too much - different things 0 0  Trouble relaxing 0 0  Restless 0 0  Easily annoyed or irritable 0 0  Afraid - awful might happen 0 0  Total GAD 7 Score 0 0   No results found for any visits on 10/11/24.    Assessment & Plan:   Chest pain, unspecified type -     DG Chest 2 View; Future -     EKG 12-Lead -     Lipid panel; Future -     CBC with Differential/Platelet; Future -     Comprehensive metabolic panel with GFR; Future -     Magnesium; Future -     D-dimer, quantitative; Future -     Ambulatory referral to Cardiology  Essential hypertension -     TSH; Future -     T4, free; Future -     Ambulatory referral to Cardiology  Malignant neoplasm of lower-inner quadrant of right breast of female, estrogen receptor positive (HCC) -     Ambulatory referral to Cardiology  S/P radiation therapy -     Ambulatory referral to Cardiology  Brief CP lasting 1 hr.  Discussed possible causes of sx including GERD-likely as last ate early in the afternoon, anxiety-GAD 7 score 0, CVD, subsequent effects of remote XRT for R breast cancer,uncontrolled HTN, anxiety, PE, etc.  BP previously well controlled (120s/70s-80s) on Norvasc  2.5 mg.  Continue lifestyle modifications.  Will obtain labs, EKG, and CXR.  EKG with sinus bradycardia, VR ~55, nonspecific T wave inversion in III.  No ST elevation.   RR' in V2 similar to previous EKG from 09/23/21.  Given h/o referral to Cardiology.  Additional recs based on results.  Monitor bp at home and bring log to clinic.  Given strict precautions.  Return in about  8 weeks (around  12/06/2024), or if symptoms worsen or fail to improve.   Clotilda JONELLE Single, MD

## 2024-10-12 LAB — D-DIMER, QUANTITATIVE: D-Dimer, Quant: 0.34 ug{FEU}/mL (ref <0.50)

## 2024-10-15 ENCOUNTER — Inpatient Hospital Stay: Payer: Medicare Other | Attending: Hematology and Oncology | Admitting: Hematology and Oncology

## 2024-10-15 VITALS — BP 130/80 | HR 61 | Temp 97.6°F | Resp 18 | Ht 60.5 in | Wt 137.5 lb

## 2024-10-15 DIAGNOSIS — Z7983 Long term (current) use of bisphosphonates: Secondary | ICD-10-CM | POA: Diagnosis not present

## 2024-10-15 DIAGNOSIS — Z1721 Progesterone receptor positive status: Secondary | ICD-10-CM | POA: Insufficient documentation

## 2024-10-15 DIAGNOSIS — Z79811 Long term (current) use of aromatase inhibitors: Secondary | ICD-10-CM | POA: Insufficient documentation

## 2024-10-15 DIAGNOSIS — Z170 Estrogen receptor positive status [ER+]: Secondary | ICD-10-CM | POA: Insufficient documentation

## 2024-10-15 DIAGNOSIS — M810 Age-related osteoporosis without current pathological fracture: Secondary | ICD-10-CM | POA: Insufficient documentation

## 2024-10-15 DIAGNOSIS — Z780 Asymptomatic menopausal state: Secondary | ICD-10-CM | POA: Diagnosis not present

## 2024-10-15 DIAGNOSIS — Z79899 Other long term (current) drug therapy: Secondary | ICD-10-CM | POA: Insufficient documentation

## 2024-10-15 DIAGNOSIS — C50311 Malignant neoplasm of lower-inner quadrant of right female breast: Secondary | ICD-10-CM | POA: Insufficient documentation

## 2024-10-15 DIAGNOSIS — Z1732 Human epidermal growth factor receptor 2 negative status: Secondary | ICD-10-CM | POA: Diagnosis not present

## 2024-10-15 DIAGNOSIS — N951 Menopausal and female climacteric states: Secondary | ICD-10-CM | POA: Insufficient documentation

## 2024-10-15 DIAGNOSIS — Z9011 Acquired absence of right breast and nipple: Secondary | ICD-10-CM | POA: Insufficient documentation

## 2024-10-15 MED ORDER — ANASTROZOLE 1 MG PO TABS: 1.0000 mg | ORAL_TABLET | Freq: Every day | ORAL | 3 refills | Status: AC

## 2024-10-15 NOTE — Progress Notes (Signed)
 Patient Care Team: Mercer Clotilda SAUNDERS, MD as PCP - General (Family Medicine) Camillo Golas, MD as Consulting Physician (Ophthalmology) Curvin Deward MOULD, MD as Consulting Physician (General Surgery) Odean Potts, MD as Consulting Physician (Hematology and Oncology) Izell Domino, MD as Attending Physician (Radiation Oncology) Crawford Morna Pickle, NP as Nurse Practitioner (Hematology and Oncology) Dove, Natro D, RN as Registered Nurse  DIAGNOSIS:  Encounter Diagnosis  Name Primary?   Malignant neoplasm of lower-inner quadrant of right breast of female, estrogen receptor positive (HCC) Yes    SUMMARY OF ONCOLOGIC HISTORY: Oncology History  Malignant neoplasm of lower-inner quadrant of right female breast (HCC)  08/04/2021 Initial Diagnosis   2001: Lumpectomy and radiation right breast Current: Palpable thickening in the right breast retroareolar, ultrasound showed mass at 4:00: 1 cm, axilla negative.  MRI showed 2.1 cm retroareolar mass plus additional 1.2 cm mass (not biopsied), biopsy revealed grade 2 IDC prognostic panel pending   08/11/2021 Cancer Staging   Staging form: Breast, AJCC 8th Edition - Clinical: Stage IB (cT2, cN0, cM0, G2, ER+, PR+, HER2-) - Signed by Odean Potts, MD on 08/11/2021 Histologic grading system: 3 grade system   07/2021 - 07/2026 Anti-estrogen oral therapy   Anastrozole  1 mg tablet daily.   09/27/2021 Surgery   Right mastectomy: Grade 2 IDC 2.1 cm with high-grade DCIS, separate focus of DCIS intermediate grade 1 cm involving CSL, margins negative, 0/2 lymph nodes negative, ER 95%, PR 90%, HER2 negative, Ki-67 10%   09/27/2021 Surgery   Right breast mastectomy with sentinel node biopsy   Cancer of right female breast (HCC)  07/2021 - 07/2026 Anti-estrogen oral therapy   Anastrozole  1 mg tablet daily.   09/27/2021 Initial Diagnosis   Cancer of right female breast (HCC)   09/27/2021 Surgery   Right breast mastectomy with sentinel node biopsy      CHIEF COMPLIANT: Surveillance of breast cancer on anastrozole  therapy  HISTORY OF PRESENT ILLNESS:   History of Present Illness Christine Lozano is a 68 year old female with breast cancer who presents for a follow-up visit.  She has been on anastrozole  for three years for estrogen receptor-positive breast cancer, with stable hot flashes and no chest pain. Her last surveillance mammogram was on December 31st of the previous year. She takes weekly alendronate  for osteoporosis on an empty stomach with water as directed.     ALLERGIES:  has no known allergies.  MEDICATIONS:  Current Outpatient Medications  Medication Sig Dispense Refill   alendronate  (FOSAMAX ) 70 MG tablet Take 1 tablet (70 mg total) by mouth once a week. Take with a full glass of water on an empty stomach. 12 tablet 3   amLODipine  (NORVASC ) 5 MG tablet Take 5 mg by mouth daily.     anastrozole  (ARIMIDEX ) 1 MG tablet Take 1 tablet (1 mg total) by mouth daily. 90 tablet 3   Calcium Carb-Cholecalciferol (CALCIUM 1000 + D PO) Take 2 tablets by mouth in the morning and at bedtime.     Cholecalciferol (VITAMIN D3) 10 MCG/ML LIQD Take 2,000 Units by mouth daily.     No current facility-administered medications for this visit.    PHYSICAL EXAMINATION: ECOG PERFORMANCE STATUS: 1 - Symptomatic but completely ambulatory  Vitals:   10/15/24 0836  BP: 130/80  Pulse: 61  Resp: 18  Temp: 97.6 F (36.4 C)  SpO2: 100%   Filed Weights   10/15/24 0836  Weight: 137 lb 8 oz (62.4 kg)    Physical Exam Breast exam:  No palpable lumps or nodules  (exam performed in the presence of a chaperone)  LABORATORY DATA:  I have reviewed the data as listed    Latest Ref Rng & Units 10/11/2024   11:54 AM 09/23/2021    3:40 PM 08/11/2021   12:34 PM  CMP  Glucose 70 - 99 mg/dL 85  879  93   BUN 6 - 23 mg/dL 13  6  12    Creatinine 0.40 - 1.20 mg/dL 9.13  9.28  9.13   Sodium 135 - 145 mEq/L 137  140  142   Potassium 3.5 - 5.1 mEq/L  4.2  3.9  4.1   Chloride 96 - 112 mEq/L 101  107  106   CO2 19 - 32 mEq/L 30  29  27    Calcium 8.4 - 10.5 mg/dL 9.4  9.0  9.3   Total Protein 6.0 - 8.3 g/dL 7.4   7.6   Total Bilirubin 0.2 - 1.2 mg/dL 0.6   0.5   Alkaline Phos 39 - 117 U/L 71   87   AST 0 - 37 U/L 17   20   ALT 0 - 35 U/L 11   13     Lab Results  Component Value Date   WBC 3.9 (L) 10/11/2024   HGB 12.3 10/11/2024   HCT 37.5 10/11/2024   MCV 83.4 10/11/2024   PLT 214.0 10/11/2024   NEUTROABS 1.8 10/11/2024    ASSESSMENT & PLAN:  Malignant neoplasm of lower-inner quadrant of right female breast (HCC) 08/04/2021: Palpable thickening in the right breast retroareolar, ultrasound showed mass at 4:00: 1 cm, axilla negative.  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 Oncotype DX: 17: 5% risk of distant recurrence (no role of chemo) 2. Adjuvant antiestrogen therapy (patient started anastrozole  prior to surgery and she will continue with the same) ------------------------------------------------------------------------------------------------------------------------------------- Anastrozole  toxicities: Joint stiffness in the hands: Much improved 2. weight gain: Menopausal symptoms better 3.  Mild hot flashes   Breast cancer surveillance: Mammogram left breast 11/21/2023: Benign breast density category B, patient will need a new mammogram.  She will call and schedule that appointment. Breast exam 10/15/2024: Benign   Osteoporosis: Bone density is T score -3.2 on 08/15/2022: Calcium vitamin D weightbearing exercise and bisphosphonate therapy.  Currently on Fosamax . I will order a  bone density test to be done in the next 3 months.   Return to clinic in 1 year for follow-up ------------------------------------- Assessment and Plan Assessment & Plan Estrogen receptor positive malignant neoplasm of lower-inner quadrant of right breast, status post treatment with anastrozole  Currently on anastrozole  with tolerable hot flashes. - Sent refill for anastrozole  to pharmacy on [address]. - Instructed to schedule mammogram for December 31st or shortly thereafter.  Age-related osteoporosis managed with alendronate  On alendronate  once weekly. Discussed rare side effect of non-healing of the jaw with major dental procedures. - Continue alendronate  70 MG oral once weekly. - Advised to inform dentist about alendronate  use before any dental procedures. - Scheduled bone density test for next year.      No orders of the defined types were placed in this encounter.  The patient has a good understanding of the overall plan. she agrees with it. she will call with any problems that may develop before the next visit here.  I personally spent a total of 30 minutes in the care of the patient today including preparing to see the patient, getting/reviewing separately obtained history, performing a medically appropriate exam/evaluation, counseling and educating, placing orders, referring and communicating with other health care professionals, documenting clinical information in the EHR, independently interpreting results, communicating results, and coordinating care.   Viinay K Janeli Lewison, MD 10/15/24

## 2024-10-15 NOTE — Assessment & Plan Note (Signed)
 08/04/2021: Palpable thickening in the right breast retroareolar, ultrasound showed mass at 4:00: 1 cm, axilla negative.  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 Oncotype DX: 17: 5% risk of distant recurrence (no role of chemo) 2. Adjuvant antiestrogen therapy (patient started anastrozole  prior to surgery and she will continue with the same) ------------------------------------------------------------------------------------------------------------------------------------- Anastrozole  toxicities: Joint stiffness in the hands: I discussed with her about using turmeric to decrease inflammation 2. weight gain: Menopausal symptoms better   Breast cancer surveillance: Mammogram left breast 11/21/2023: Benign breast density category B Breast exam 10/15/2024: Benign   Osteoporosis: Bone density is T score -3.2 on 08/15/2022: Calcium vitamin D weightbearing exercise and bisphosphonate therapy.  Currently on Fosamax .   Return to clinic in 1 year for follow-up

## 2024-10-23 ENCOUNTER — Ambulatory Visit: Payer: Self-pay | Admitting: Family Medicine

## 2024-10-28 ENCOUNTER — Ambulatory Visit: Attending: General Surgery

## 2024-10-28 VITALS — Wt 139.2 lb

## 2024-10-28 DIAGNOSIS — Z483 Aftercare following surgery for neoplasm: Secondary | ICD-10-CM | POA: Insufficient documentation

## 2024-10-28 NOTE — Therapy (Signed)
  OUTPATIENT PHYSICAL THERAPY SOZO SCREENING NOTE   Patient Name: Christine Lozano MRN: [id number] DOB:07-22-1956, 68 y.o., female Today's Date: 10/28/2024  PCP: Mercer Clotilda SAUNDERS, MD REFERRING PROVIDER: Curvin Deward MOULD, MD   PT End of Session - 10/28/24 0830     Visit Number 17   # unchanged due to screen only   PT Start Time 0829    PT Stop Time 0833    PT Time Calculation (min) 4 min    Activity Tolerance Patient tolerated treatment well    Behavior During Therapy [facility] for tasks assessed/performed          Past Medical History:  Diagnosis Date   Facial twitching    Hypertension    Personal history of radiation therapy    Past Surgical History:  Procedure Laterality Date   BREAST BIOPSY Right 08/04/2021   BREAST CYST EXCISION Left 8025,8019   x2   BREAST LUMPECTOMY     BUNIONECTOMY Left 10 years ago   MASTECTOMY W/ SENTINEL NODE BIOPSY Right 09/27/2021   Procedure: RIGHT MASTECTOMY WITH SENTINEL LYMPH NODE BIOPSY;  Surgeon: Curvin Deward MOULD, MD;  Location: [facility] OR;  Service: General;  Laterality: Right;   TOOTH EXTRACTION     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 She underwent a right mastectomy and sentinel node biopsy (2 negative nodes) on 09/27/2021 It is ER/PR positive and HER2 negative. She has a history of right breast cancer in 2001 with a lumpectomy, sentinel node biopsy, and radiation.   PRECAUTIONS: right UE Lymphedema risk, None  SUBJECTIVE:  Pt returns for her 6 month L-Dex screen.   PAIN:  Are you having pain? No  SOZO SCREENING: Patient was assessed today using the SOZO machine to determine the lymphedema index score. This was compared to her baseline score. It was determined that she is within the recommended range when compared to her baseline and no further action is needed at this time. She will continue SOZO screenings. These are done every 3 months for 2 years post operatively followed by every 6 months for 2 years, and then annually.   L-DEX FLOWSHEETS - 10/28/24 0800       L-DEX LYMPHEDEMA SCREENING   Measurement Type Unilateral    L-DEX MEASUREMENT EXTREMITY Upper Extremity    POSITION  Standing    DOMINANT SIDE Left    At Risk Side Right    BASELINE SCORE (UNILATERAL) 6    L-DEX SCORE (UNILATERAL) -0.7    VALUE CHANGE (UNILAT) -6.7         P: Cont 6 month L-Dex screens until 09/2025 then can transition to annual.    Aden Berwyn Caldron, PTA 10/28/2024, 8:31 AM

## 2024-11-02 ENCOUNTER — Other Ambulatory Visit: Payer: Self-pay | Admitting: Hematology and Oncology

## 2024-11-27 ENCOUNTER — Other Ambulatory Visit: Payer: Self-pay | Admitting: Obstetrics and Gynecology

## 2024-11-27 DIAGNOSIS — Z1231 Encounter for screening mammogram for malignant neoplasm of breast: Secondary | ICD-10-CM

## 2024-12-12 ENCOUNTER — Ambulatory Visit

## 2024-12-17 ENCOUNTER — Inpatient Hospital Stay: Admission: RE | Admit: 2024-12-17 | Source: Ambulatory Visit

## 2024-12-20 ENCOUNTER — Ambulatory Visit: Attending: Physician Assistant | Admitting: Physician Assistant

## 2024-12-20 ENCOUNTER — Other Ambulatory Visit: Payer: Self-pay

## 2024-12-20 VITALS — BP 135/77 | HR 65

## 2024-12-20 DIAGNOSIS — R072 Precordial pain: Secondary | ICD-10-CM

## 2024-12-20 DIAGNOSIS — R079 Chest pain, unspecified: Secondary | ICD-10-CM | POA: Insufficient documentation

## 2024-12-20 NOTE — Progress Notes (Unsigned)
 " Cardiology Office Note   Date:  12/21/2024  ID:  Christine Lozano, DOB 1956-09-22, MRN [id number] PCP: Mercer Clotilda SAUNDERS, MD  [facility] Providers Cardiologist:  [facility]    History of Present Illness Christine Lozano is a 69 y.o. female with PMH of HTN, R breast CA s/p XRT and anastrozole  and blepharospasm.  Patient was last seen by her PCP on 10/11/2024 for chest pain.  Blood pressure was elevated at the time of symptom at 172/95.  She was initially going to go to the emergency room however later changed her mind.  Blood work showed white blood cell count borderline low at 3.9, normal hemoglobin.  LDL of 105.  Normal renal function and electrolyte.  D-dimer normal.  Normal magnesium and thyroid  panel.  Chest x-ray showed no acute finding.  EKG showed sinus rhythm with incomplete right bundle branch block and borderline LVH, but no significant ST-T wave changes.  Patient was referred to cardiology service for further evaluation.  She has been followed by Dr. Odean of hematology/oncology service for history of right breast cancer dating back to 2001.  She previously underwent lumpectomy and radiation therapy of the right breast.  She has also received antiestrogen oral therapy.  She underwent complete a right mastectomy surgery in November 2022.  [facility] for [facility].  Her first episode of chest pain was back in November, symptom lasted about an hour.  She described as a central chest burning sensation.  She was not exerting herself at the time.  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  At this time, it only lasted about 20 minutes.  She denies any significant shortness of breath.  She has not been doing much activity recently however is functionally independent.  Blood pressure is fairly controlled on amlodipine .  EKG showed sinus rhythm, LVH, however previously seen incomplete  right bundle branch block disappeared on today's EKG.  There was no significant ST-T wave changes.  Given LVH, I recommended echocardiogram.  I will also obtain a coronary CTA to assess her chest pain.  Her heart rate is in the low 60s, therefore does not need a beta-blocker.  She will need a basic metabolic panel prior to the study.  If coronary CT and echo come back normal, she does not need any further workup and may follow-up with cardiology service as needed.  If both test come back normal, I would recommend focus on over-the-counter Pepcid or Tums to help with possible acid reflux symptoms.  ROS:   Patient complains of intermittent chest pain.  She denies any significant shortness of breath.  She has no lower extremity edema, orthopnea or PND.  Studies Reviewed EKG Interpretation Date/Time:  Friday December 20 2024 14:23:58 EST Ventricular Rate:  64 PR Interval:  150 QRS Duration:  80 QT Interval:  422 QTC Calculation: 435 R Axis:   -11  Text Interpretation: Normal sinus rhythm Moderate voltage criteria for LVH, may be normal variant ( R in aVL , Cornell product ) When compared with ECG of 23-Sep-2021 15:36, Incomplete right bundle branch block is no longer Present Confirmed by Charley Lafrance [phone]) on 12/20/2024 2:58:07 PM        Risk Assessment/Calculations           Physical Exam VS:  BP 135/77 (Cuff Size: Normal)   Pulse 65   SpO2 99%  Wt Readings from Last 3 Encounters:  10/28/24 139 lb 4 oz (63.2 kg)  10/15/24 137 lb 8 oz (62.4 kg)  10/11/24 138 lb (62.6 kg)    GEN: Well nourished, well developed in no acute distress NECK: No JVD; No carotid bruits CARDIAC: RRR, no murmurs, rubs, gallops RESPIRATORY:  Clear to auscultation without rales, wheezing or rhonchi  ABDOMEN: Soft, non-tender, non-distended EXTREMITIES:  No edema; No deformity   ASSESSMENT AND PLAN  Precordial chest pain: Will obtain echocardiogram and coronary CTA.  If both test come back negative,  would consider as needed dose of Pepcid versus Tums.  Incomplete right bundle branch block: Interestingly, despite previous EKG showed incomplete right bundle branch block, this has resolved on today's EKG.       Dispo: Follow-up as needed if echocardiogram and coronary CT come back normal.  Signed, Scot Ford, PA  "

## 2024-12-20 NOTE — Patient Instructions (Addendum)
 Medication Instructions:  No med changes this visit *If you need a refill on your cardiac medications before your next appointment, please call your pharmacy*  Lab Work: The provider wants you to have the following lab work drawn before CT BMET  You may also go to any of these [facility] locations:   [facility] - [address] ([facility]) - [address] [phone] [address]   [place] - [address]    [place]  - [address]    [place] - [address] - [address] [employer]  - [address] ([facility]    If you have labs (blood work) drawn today and your tests are completely normal, you will receive your results only by: [employer] (if you have MyChart) OR A paper copy in the mail If you have any lab test that is abnormal or we need to change your treatment, we will call you to review the results.  Testing/Procedures: Your physician has requested that you have cardiac CT. Cardiac computed tomography (CT) is a painless test that uses an x-ray machine to take clear, detailed pictures of your heart. For further information please visit [email or web address] Please follow instruction sheet as given.    Your cardiac CT will be scheduled at one of the below locations:   [facility] [address] [place], [place] [address] [phone] (Severe contrast allergies only)  OR   [facility] [address] [place], [place] [address] [phone]  OR   [facility] [address] [place], [place] [address] [phone]  OR   [facility] and V[facility] Tower [address]  [place], [place] [address]  OR   [facility] [address] [place], [place] [phone]  If scheduled at [facility], please arrive at the [facility] and Children's  Entrance (Entrance C2) of [facility] 30 minutes prior to test start time. You can use the FREE valet parking offered at entrance C (encouraged to control the heart rate for the test)  Proceed to the [facility] Radiology Department (first floor) to check-in and test prep.  All radiology patients and guests should use entrance C2 at [facility], accessed from [facility] even though the hospital's physical address listed is [address]  If scheduled at the Heart and Vascular Tower at [address], please enter the parking lot using the [address] entrance and use the FREE valet service at the patient drop-off area. Enter the building and check-in with registration on the main floor.  If scheduled at [facility], please arrive to the Heart and Vascular Center 15 mins early for check-in and test prep.  There is spacious parking and easy access to the radiology department from the [facility] Heart and Vascular entrance. Please enter here and check-in with the desk attendant.   If scheduled at [facility], please arrive 30 minutes early for check-in and test prep.  Please follow these instructions carefully (unless otherwise directed):  An IV will be required for this test and Nitroglycerin will be given.  Hold all erectile dysfunction medications at least 3 days (72 hrs) prior to test. (Ie viagra, cialis, sildenafil, tadalafil, etc)   On the Night Before the Test: Be sure to Drink plenty of  water. Do not consume any caffeinated/decaffeinated beverages or chocolate 12 hours prior to your test. Do not take any antihistamines 12 hours prior to your test.  On the Day of the Test: Drink plenty of water until 1 hour prior to the test. Do not eat any food 1 hour prior to test. You may take your regular medications prior to the test.  FEMALES- please wear underwire-free bra if available, avoid dresses & tight clothing       After the Test: Drink plenty of water. After receiving IV contrast, you may experience a mild flushed feeling. This is normal. On occasion, you may experience a mild rash up to 24 hours after the test. This is not dangerous. If this occurs, you can take Benadryl 25 mg, Zyrtec, Claritin, or Allegra and increase your fluid intake. If you experience trouble breathing, this can be serious. If it is severe call 911 IMMEDIATELY. If it is mild, please call our office.  We will call to schedule your test 2-4 weeks out understanding that some insurance companies will need an authorization prior to the service being performed.   For more information and frequently asked questions, please visit our website : [email or web address]  For non-scheduling related questions, please contact the cardiac imaging nurse navigator should you have any questions/concerns: Cardiac Imaging Nurse Navigators Direct Office Dial: [phone]   For scheduling needs, including cancellations and rescheduling, please call Brittany, [phone].  For billing questions, please call [phone].     Your physician has requested that you have an echocardiogram. Echocardiography is a painless test that uses sound waves to create images of your heart. It provides your doctor with information about the size and shape of your heart and how well your hearts chambers and valves are working. This procedure takes approximately one hour. There are no restrictions for this procedure. Please do NOT wear cologne, perfume, aftershave, or lotions (deodorant is allowed). Please arrive 15 minutes prior to your appointment time.  Please note: We ask at that you not bring children with you during ultrasound (echo/ vascular) testing. Due to room size and safety concerns, children are not allowed in the ultrasound rooms during exams. Our front office staff cannot provide observation of children in our lobby area while testing is being  conducted. An adult accompanying a patient to their appointment will only be allowed in the ultrasound room at the discretion of the ultrasound technician under special circumstances. We apologize for any inconvenience.   Follow-Up: At [facility], you and your health needs are our priority.  As part of our continuing mission to provide you with exceptional heart care, our providers are all part of one team.  This team includes your primary Cardiologist (physician) and Advanced Practice Providers or APPs (Physician Assistants and Nurse Practitioners) who all work together to provide you with the care you need, when you need it.  Your next appointment:   As needed depending on CT results  Provider:   Scot Ford, PA  We recommend signing up for the patient portal called MyChart.  Sign up information is provided on this After Visit Summary.  MyChart is used to connect with patients for Virtual Visits (Telemedicine).  Patients are able to view lab/test results, encounter notes, upcoming appointments, etc.  Non-urgent messages can be sent to your provider as well.   To learn more about what you can do with MyChart, go to [email or web address].

## 2025-01-01 ENCOUNTER — Ambulatory Visit

## 2025-01-08 ENCOUNTER — Ambulatory Visit (HOSPITAL_COMMUNITY)

## 2025-01-28 ENCOUNTER — Ambulatory Visit (HOSPITAL_COMMUNITY)

## 2025-04-28 ENCOUNTER — Ambulatory Visit: Attending: General Surgery

## 2025-10-13 ENCOUNTER — Inpatient Hospital Stay: Admitting: Hematology and Oncology
